# Patient Record
Sex: Female | Born: 1970 | Race: Black or African American | Hispanic: No | Marital: Married | Smoking: Never smoker
Health system: Southern US, Community
[De-identification: ages and names within clinical notes are randomized; demographics above are authoritative.]

## PROBLEM LIST (undated history)

## (undated) DIAGNOSIS — M109 Gout, unspecified: Secondary | ICD-10-CM

## (undated) DIAGNOSIS — E669 Obesity, unspecified: Secondary | ICD-10-CM

## (undated) DIAGNOSIS — I10 Essential (primary) hypertension: Secondary | ICD-10-CM

## (undated) HISTORY — DX: Obesity, unspecified: E66.9

## (undated) HISTORY — DX: Essential (primary) hypertension: I10

---

## 1998-07-18 ENCOUNTER — Encounter: Payer: Self-pay | Admitting: Family Medicine

## 1998-07-18 ENCOUNTER — Ambulatory Visit (HOSPITAL_COMMUNITY): Admission: RE | Admit: 1998-07-18 | Discharge: 1998-07-18 | Payer: Self-pay | Admitting: Family Medicine

## 2005-11-13 ENCOUNTER — Emergency Department (HOSPITAL_COMMUNITY): Admission: EM | Admit: 2005-11-13 | Discharge: 2005-11-13 | Payer: Self-pay | Admitting: Family Medicine

## 2005-11-15 ENCOUNTER — Ambulatory Visit (HOSPITAL_COMMUNITY): Admission: RE | Admit: 2005-11-15 | Discharge: 2005-11-15 | Payer: Self-pay | Admitting: Family Medicine

## 2007-09-30 IMAGING — US US TRANSVAGINAL NON-OB
1 series · 14 of 25 positions shown · non-contrast
Comparison: None available.

CLINICAL DATA: Vaginal bleeding.  Evaluate for fibroids.  
TRANSABDOMINAL AND TRANSVAGINAL PELVIC ULTRASOUND:
TECHNIQUE: Both transabdominal and transvaginal ultrasound examinations of the pelvis were performed including evaluation of the uterus, ovaries, adnexal regions, and pelvic cul-de-sac.

[Series 1: unknown · 0.29mm/px · 14 of 70 slices shown]
[im 1/70]
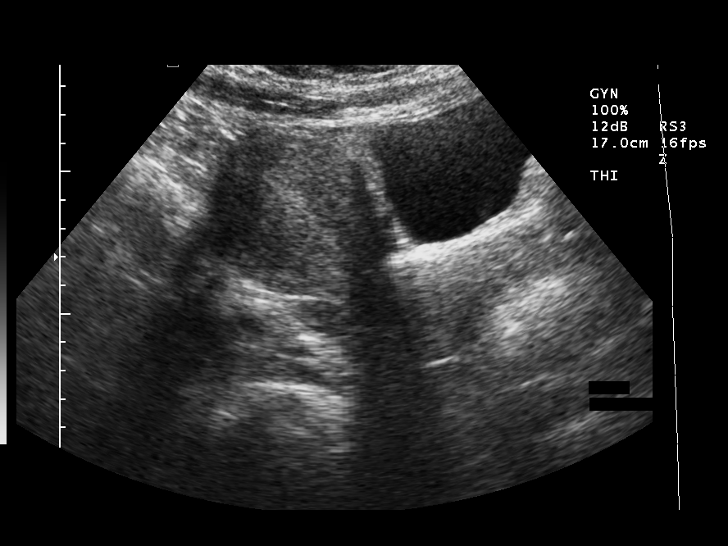
[im 6/70]
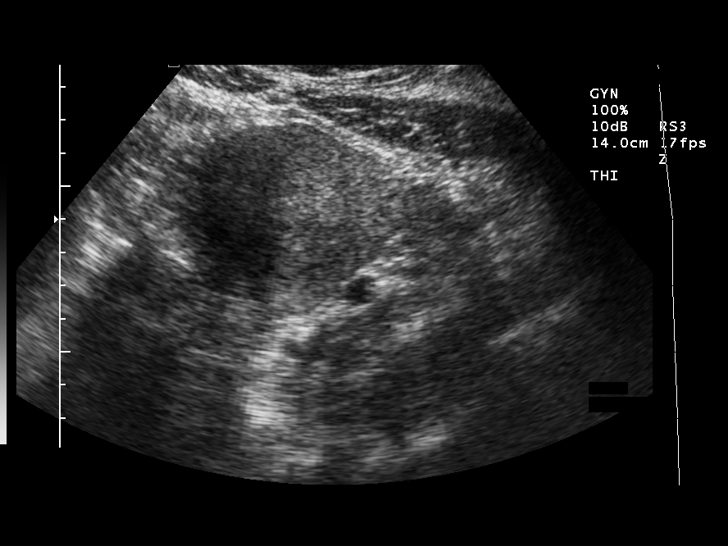
[im 12/70]
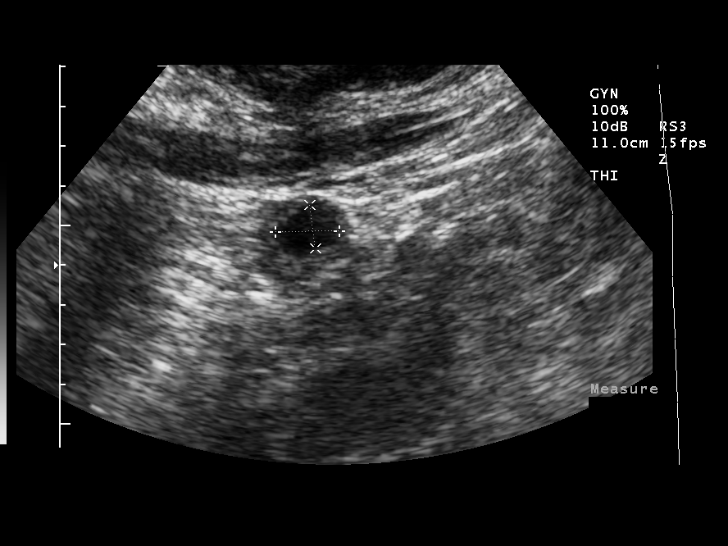
[im 18/70]
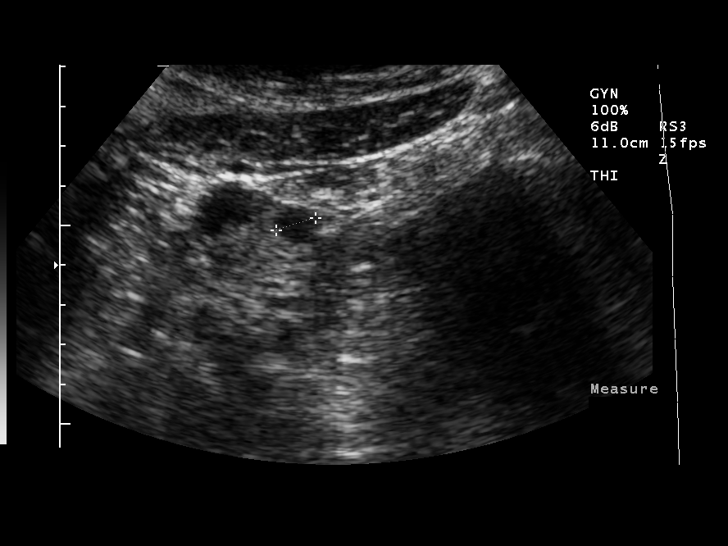
[im 24/70]
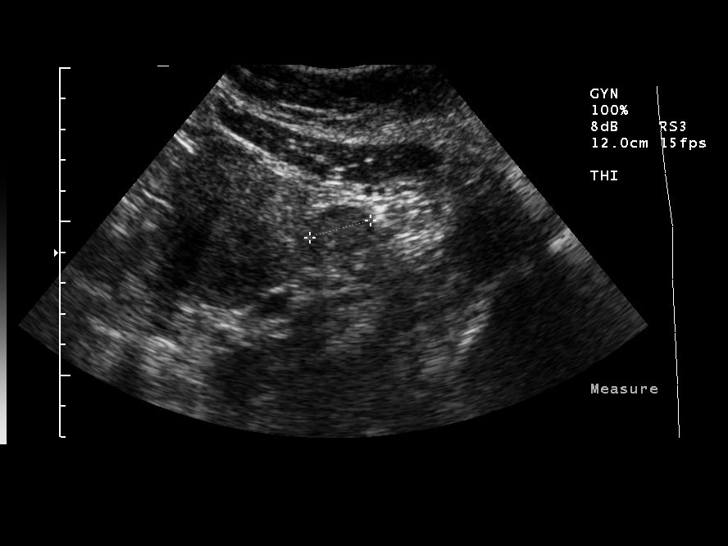
[im 26/70]
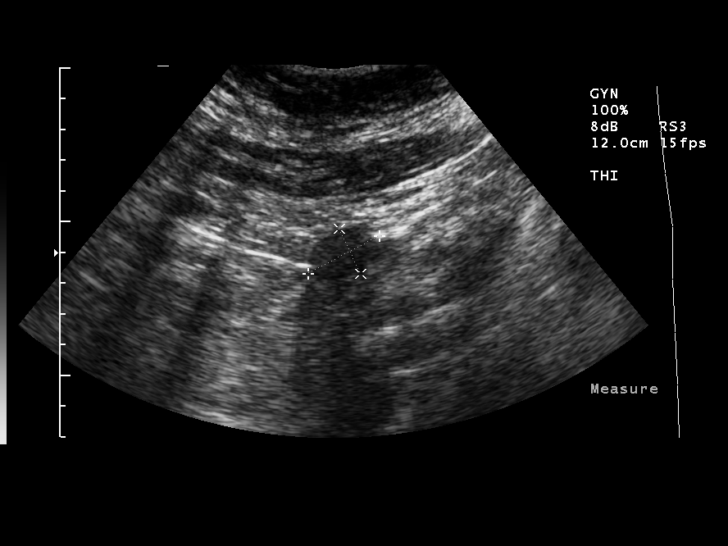
[im 32/70]
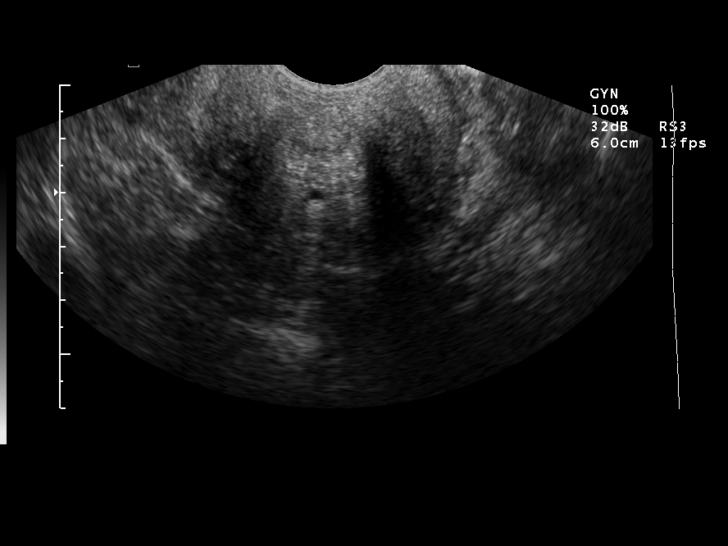
[im 38/70]
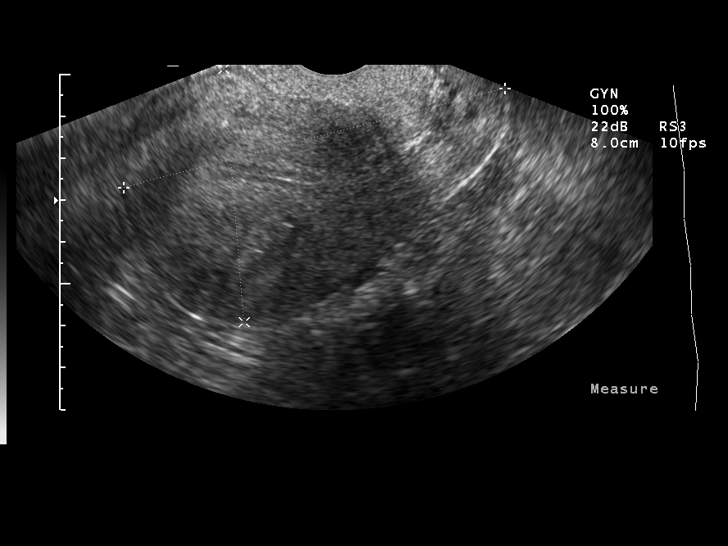
[im 44/70]
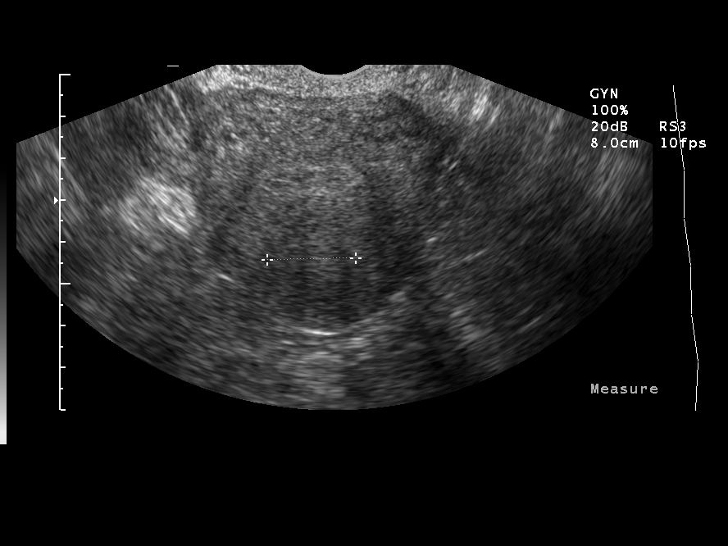
[im 47/70]
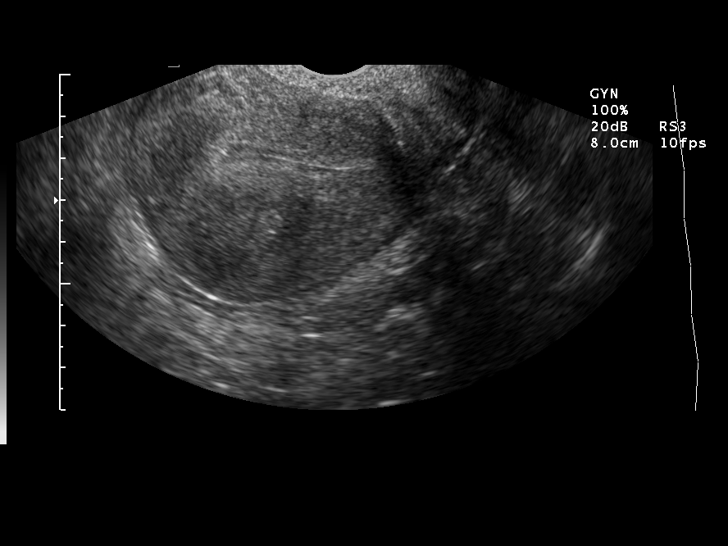
[im 52/70]
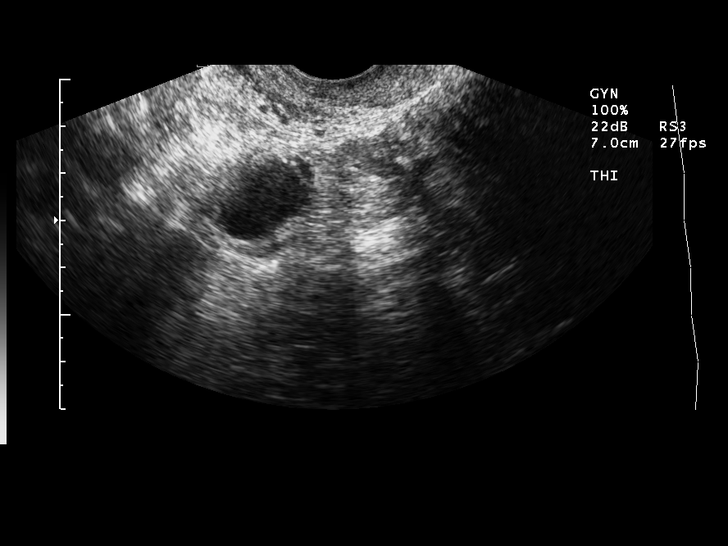
[im 58/70]
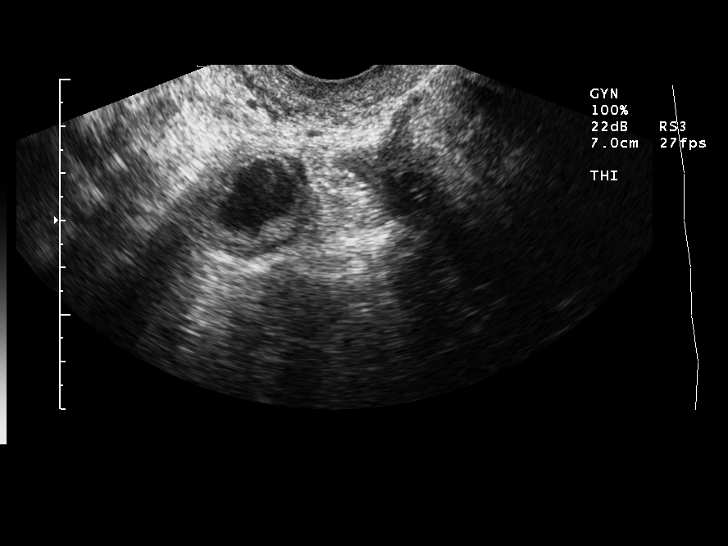
[im 64/70]
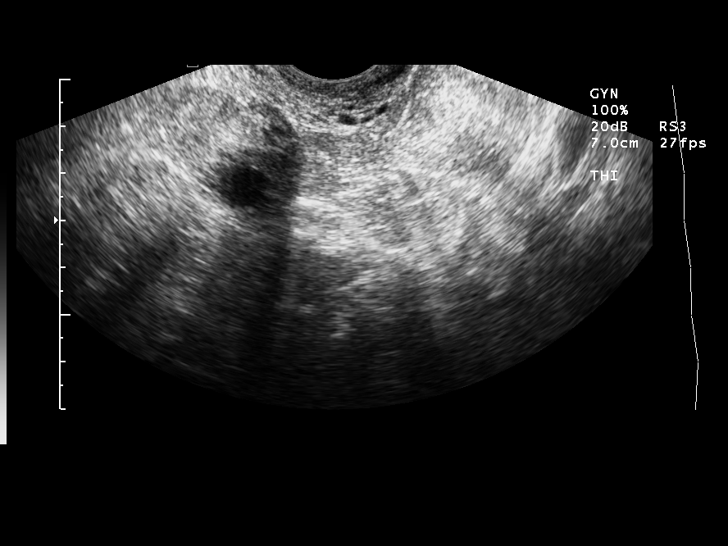
[im 70/70]
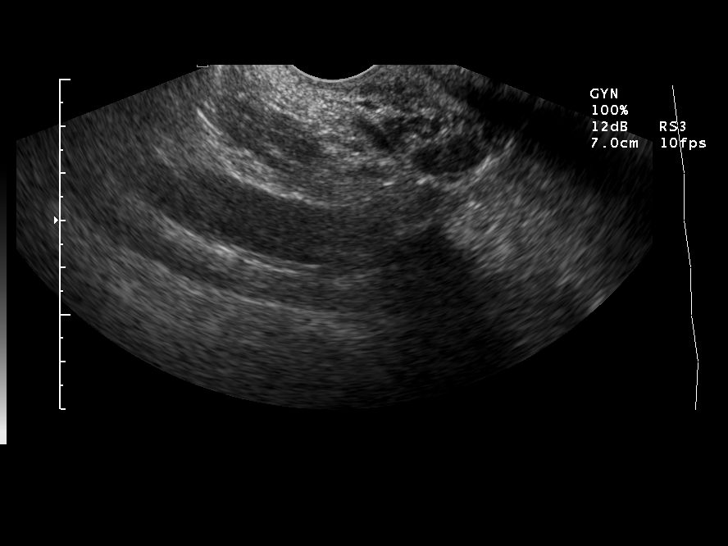

[14 of 25 positions shown; findings below may reference images not displayed]

FINDINGS: The uterus measures 9.4 X 6.1 X 6.1 cm.  Endometrial strip is 6 mm in thickness.  Myometrial echotexture is somewhat heterogeneous, especially in the lower uterine segment.  In the posterior fundus is a focal 2.7 cm area of altered echotexture suggesting an intramural fibroid and there is probably a subcentimeter fibroid within the wall just below this. 
The right ovary measures 3.7 X 2.5 X 2.5 cm and is sonographically normal.  The left ovary measures 2.6 X 1.6 X 2.1 cm and also has normal features.  No evidence for intraperitoneal free fluid.
IMPRESSION: Normal size of uterus with probable 2.7 cm posterior intramural fibroid near the fundus.  The myometrium is fairly heterogeneous in some areas and adenomyosis would also be a consideration.  Consider MRI to further evaluate as clinically appropriate.

## 2010-09-22 ENCOUNTER — Other Ambulatory Visit: Payer: Self-pay | Admitting: Family Medicine

## 2010-09-22 ENCOUNTER — Other Ambulatory Visit: Payer: Self-pay | Admitting: Internal Medicine

## 2010-09-22 DIAGNOSIS — E049 Nontoxic goiter, unspecified: Secondary | ICD-10-CM

## 2010-10-13 ENCOUNTER — Ambulatory Visit
Admission: RE | Admit: 2010-10-13 | Discharge: 2010-10-13 | Disposition: A | Discharge status: Home / Self Care | Payer: Managed Care, Other (non HMO) | Source: Ambulatory Visit | Attending: Family Medicine | Admitting: Family Medicine

## 2010-10-13 DIAGNOSIS — E049 Nontoxic goiter, unspecified: Secondary | ICD-10-CM

## 2011-08-13 ENCOUNTER — Encounter: Payer: Self-pay | Admitting: Family Medicine

## 2011-08-13 ENCOUNTER — Ambulatory Visit (INDEPENDENT_AMBULATORY_CARE_PROVIDER_SITE_OTHER): Payer: Managed Care, Other (non HMO) | Admitting: Family Medicine

## 2011-08-13 DIAGNOSIS — E119 Type 2 diabetes mellitus without complications: Secondary | ICD-10-CM

## 2011-08-13 DIAGNOSIS — Z23 Encounter for immunization: Secondary | ICD-10-CM

## 2011-08-13 DIAGNOSIS — I10 Essential (primary) hypertension: Secondary | ICD-10-CM

## 2011-08-13 DIAGNOSIS — E669 Obesity, unspecified: Secondary | ICD-10-CM

## 2011-08-13 LAB — HEMOGLOBIN A1C: Hgb A1c MFr Bld: 6.6 % — AB (ref 4.0–6.0)

## 2012-06-13 ENCOUNTER — Ambulatory Visit (INDEPENDENT_AMBULATORY_CARE_PROVIDER_SITE_OTHER): Payer: Managed Care, Other (non HMO) | Admitting: Family Medicine

## 2012-06-13 VITALS — BP 130/72 | HR 90 | Temp 98.2°F | Resp 16 | Ht 68.0 in | Wt 289.0 lb

## 2012-06-13 DIAGNOSIS — I10 Essential (primary) hypertension: Secondary | ICD-10-CM

## 2012-06-13 DIAGNOSIS — E119 Type 2 diabetes mellitus without complications: Secondary | ICD-10-CM

## 2012-06-13 LAB — COMPREHENSIVE METABOLIC PANEL
AST: 17 U/L (ref 0–37)
Alkaline Phosphatase: 70 U/L (ref 39–117)
BUN: 11 mg/dL (ref 6–23)
Creat: 0.95 mg/dL (ref 0.50–1.10)

## 2012-06-13 LAB — LIPID PANEL
HDL: 53 mg/dL (ref >39)
LDL Cholesterol: 104 mg/dL — ABNORMAL HIGH (ref 0–99)
Total CHOL/HDL Ratio: 3.3 Ratio

## 2012-06-13 LAB — GLUCOSE, POCT (MANUAL RESULT ENTRY): POC Glucose: 130 mg/dl — AB (ref 70–99)

## 2012-06-13 MED ORDER — LISINOPRIL-HYDROCHLOROTHIAZIDE 20-12.5 MG PO TABS: 1.0000 | ORAL_TABLET | Freq: Every day | ORAL | Status: DC

## 2012-06-13 MED ORDER — METFORMIN HCL 500 MG PO TABS: 500.0000 mg | ORAL_TABLET | Freq: Every day | ORAL | Status: DC

## 2012-06-13 NOTE — Progress Notes (Signed)
  Subjective:    Patient ID: Madison Chandler, female    DOB: September 17, 1970, 41 y.o.   MRN: [id number]  HPI Madison Chandler is a 41 y.o. female  HTN  - outside BP's - 125-131/74-90., no new side effects with meds.  No missed doses usually.  DM2 - off glucophage past 2 days only - usually on QD. Blood sugars 112-129.   Results for orders placed in visit on 08/13/11  HEMOGLOBIN A1C      Component Value Range   Hemoglobin A1C 6.6 (*) 4.0 - 6.0 %     Review of Systems  Constitutional: Negative for fatigue and unexpected weight change.  Respiratory: Negative for chest tightness and shortness of breath.   Cardiovascular: Negative for chest pain, palpitations and leg swelling.  Gastrointestinal: Negative for abdominal pain and blood in stool.  Neurological: Negative for dizziness, syncope, light-headedness and headaches.       Objective:   Physical Exam  Vitals reviewed. Constitutional: She is oriented to person, place, and time. She appears well-developed and well-nourished.  HENT:  Head: Normocephalic and atraumatic.  Mouth/Throat: Oropharynx is clear and moist.  Eyes: Conjunctivae normal and EOM are normal. Pupils are equal, round, and reactive to light.  Neck: Normal range of motion. No thyromegaly present.  Cardiovascular: Normal rate, regular rhythm, normal heart sounds and intact distal pulses.   No murmur heard. Pulmonary/Chest: Effort normal. She has no wheezes. She has no rales.  Abdominal: Soft. Normal appearance. She exhibits no abdominal bruit and no pulsatile midline mass. There is no tenderness.  Neurological: She is alert and oriented to person, place, and time.       Microfilament testing WNL bilat feet  Skin: Skin is warm and dry.  Psychiatric: She has a normal mood and affect. Her behavior is normal.   Results for orders placed in visit on 06/13/12  GLUCOSE, POCT (MANUAL RESULT ENTRY)      Component Value Range   POC Glucose 130 (*) 70 - 99 mg/dl  POCT  GLYCOSYLATED HEMOGLOBIN (HGB A1C)      Component Value Range   Hemoglobin A1C 6.7         Assessment & Plan:  [name] is a 41 y.o. female 1. Diabetes type 2, controlled  POCT glucose (manual entry), POCT glycosylated hemoglobin (Hb A1C), metFORMIN (GLUCOPHAGE) 500 MG tablet, DISCONTINUED: metFORMIN (GLUCOPHAGE) 500 MG tablet  2. Hypertension  Comprehensive metabolic panel, Lipid panel, lisinopril-hydrochlorothiazide (PRINZIDE,ZESTORETIC) 20-12.5 MG per tablet, DISCONTINUED: lisinopril-hydrochlorothiazide (PRINZIDE,ZESTORETIC) 20-12.5 MG per tablet    DM2 - controlled. Continue same med doses. 30 day supply to local pharmacy while waiting for 90day rx. Recheck in 3 months - plans on establishing care with new provider here - names given.  HTN - controlled. Check labs above. 3 month follow up.  rtc precautions.

## 2012-07-07 ENCOUNTER — Telehealth: Payer: Self-pay

## 2012-07-07 MED ORDER — METFORMIN HCL ER 500 MG PO TB24: 500.0000 mg | ORAL_TABLET | Freq: Every day | ORAL | Status: DC

## 2012-07-07 NOTE — Telephone Encounter (Signed)
Patient notified and voiced understanding.

## 2012-07-07 NOTE — Telephone Encounter (Signed)
The patient called to request that her medication be switched back to Metformin XR from the regular Metformin due to abdominal pain and bowel issues.  Please call the patient at [phone].

## 2012-07-07 NOTE — Telephone Encounter (Signed)
Sent to pharmacy - sorry that the patient had bad side effects

## 2012-08-27 IMAGING — US US SOFT TISSUE HEAD/NECK
1 series · 14 of 25 positions shown · non-contrast
Comparison: None.

CLINICAL DATA: Thyroid enlargement on physical exam.

THYROID ULTRASOUND
TECHNIQUE: Ultrasound examination of the thyroid gland and adjacent
soft tissues was performed.

[Series 1: us soft tissue head/neck · 0.06mm/px · 14 of 29 slices shown]
[im 1/29]
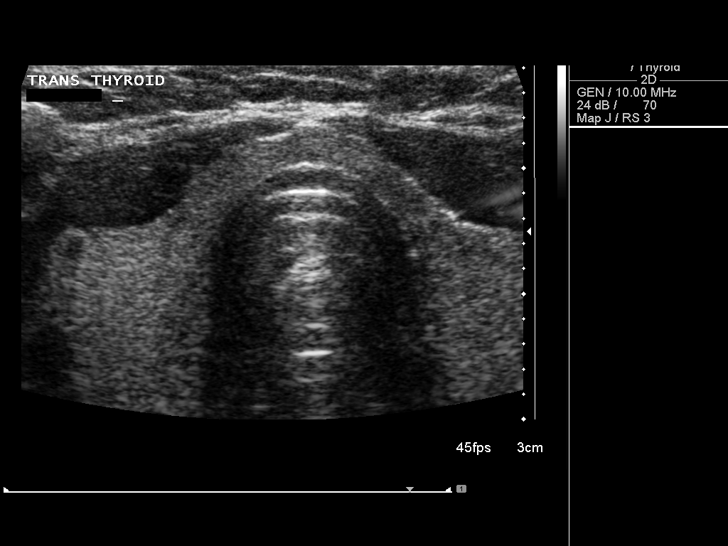
[im 3/29]
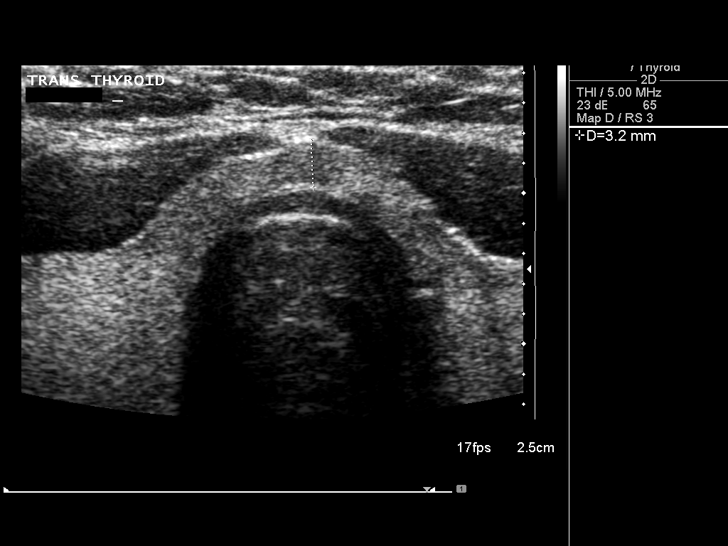
[im 5/29]
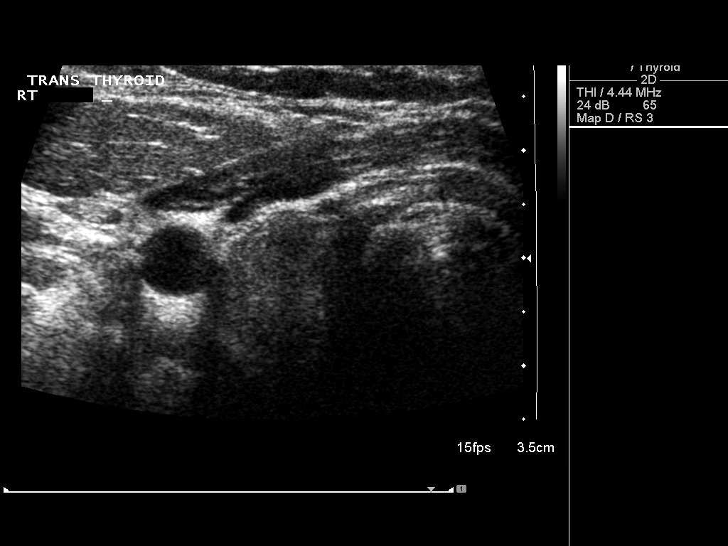
[im 8/29]
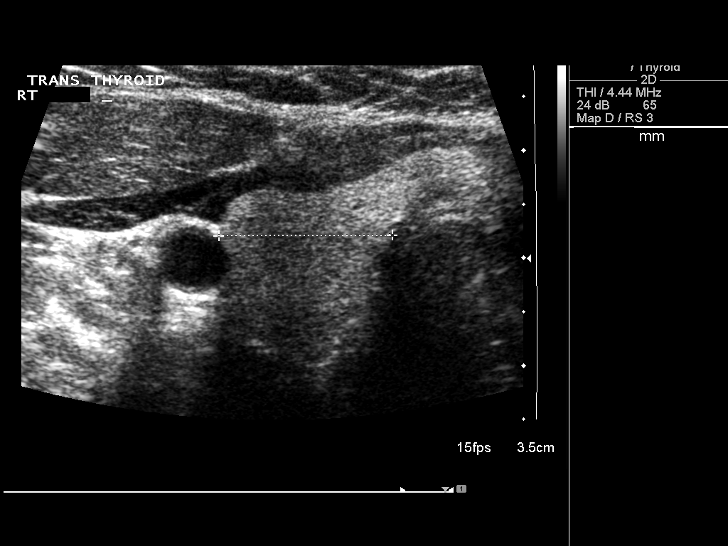
[im 10/29]
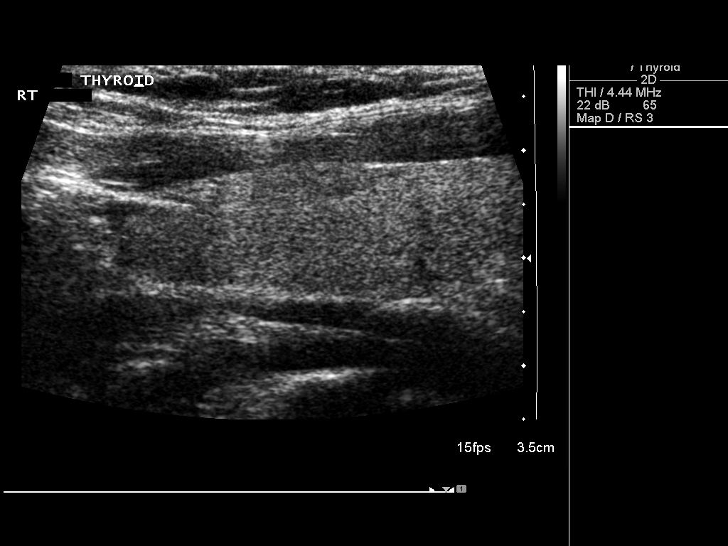
[im 11/29]
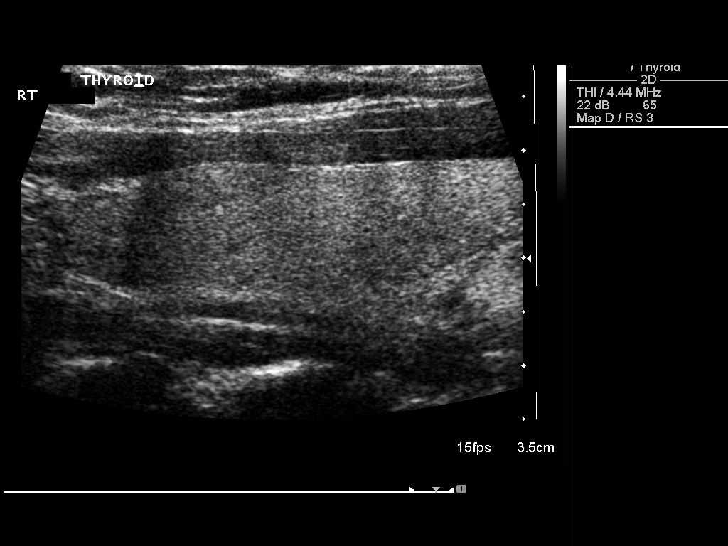
[im 13/29]
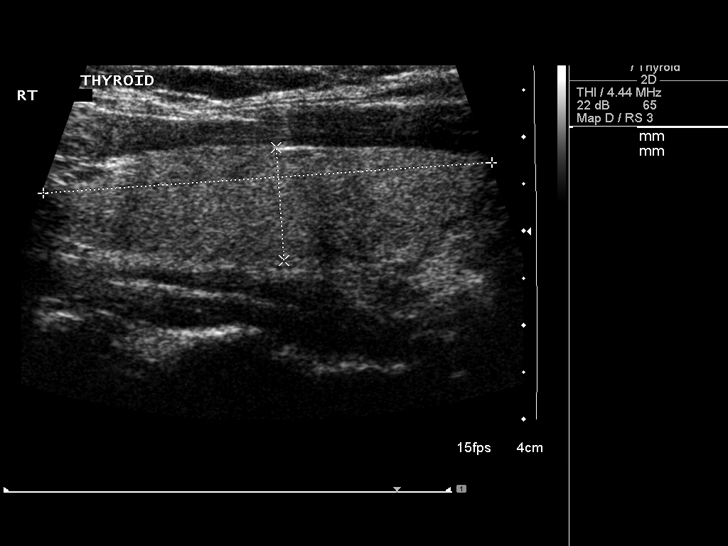
[im 16/29]
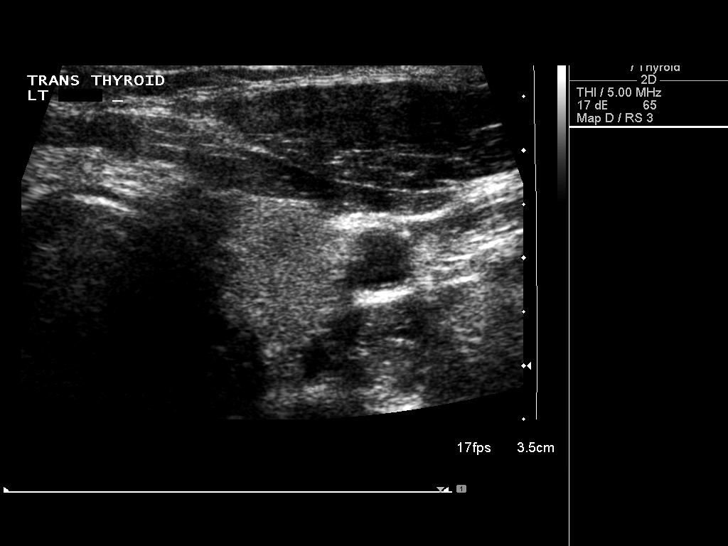
[im 18/29]
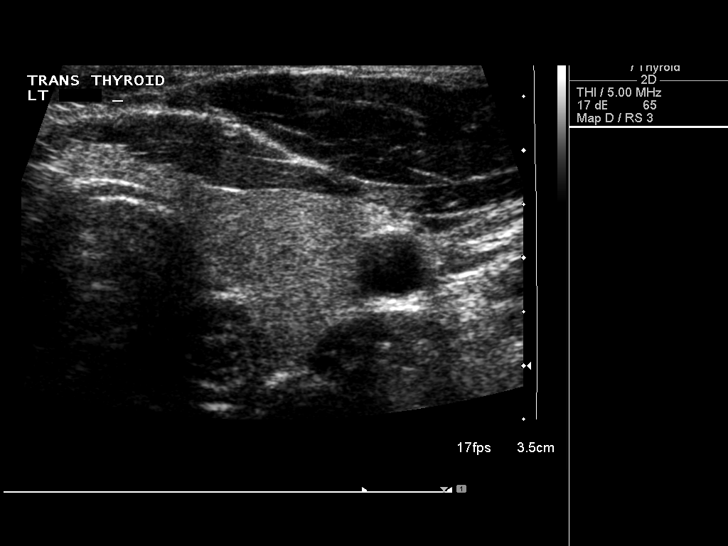
[im 19/29]
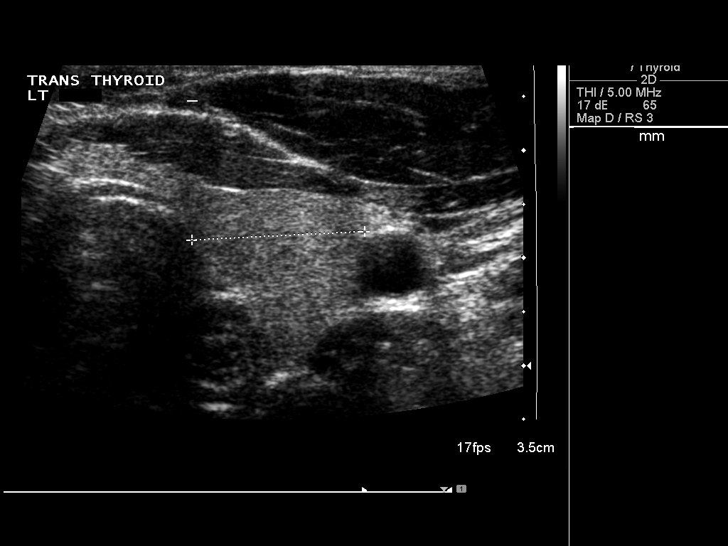
[im 22/29]
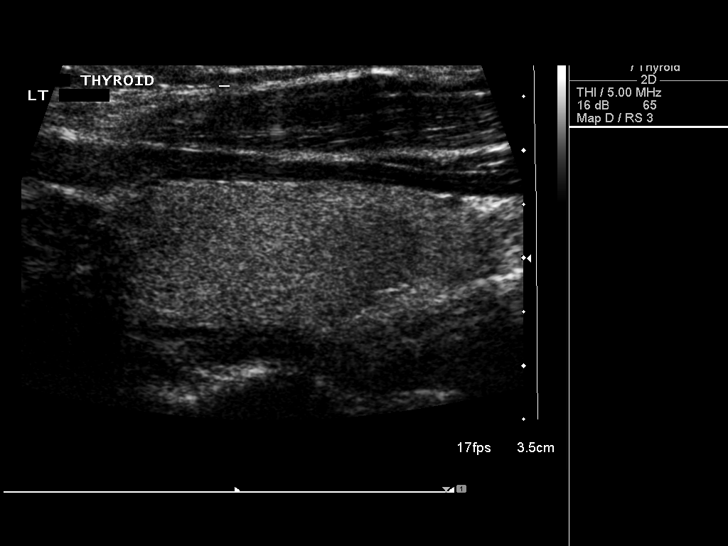
[im 24/29]
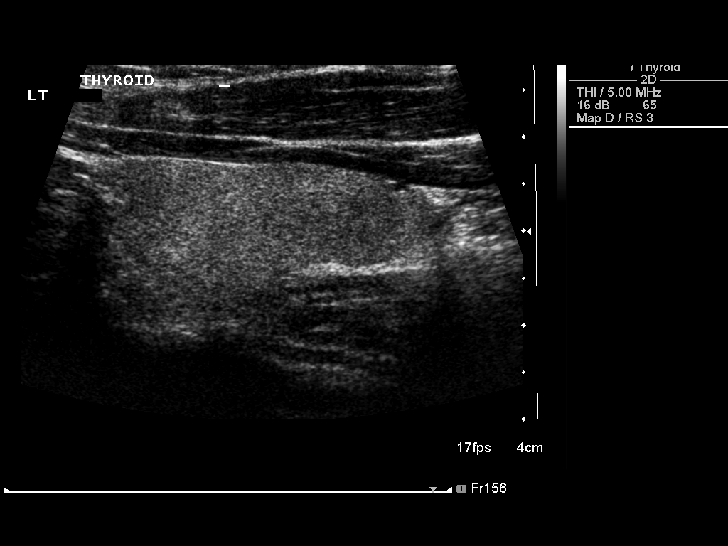
[im 26/29]
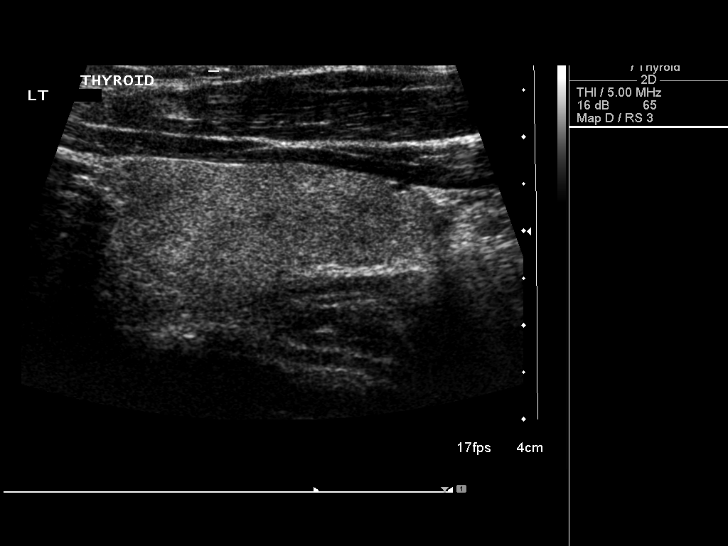
[im 29/29]
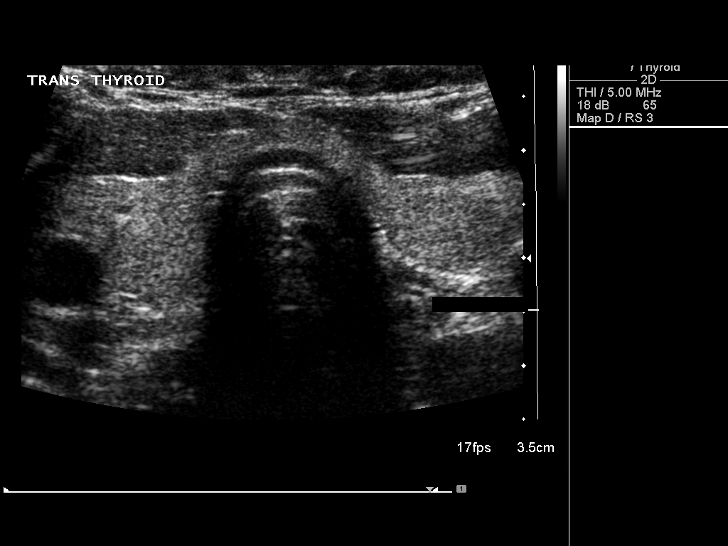

[14 of 25 positions shown; findings below may reference images not displayed]

FINDINGS: Right thyroid lobe:  Normal in size measuring 4.8 cm long X 1.2 cm
AP X 1.6 cm wide.
Left thyroid lobe:  Lower limits of normal in size measuring 3.9 cm
long X 1.6 cm AP X 1.6 cm wide.
Isthmus:  Normal 3 mm AP thickness.

Focal nodules:  Homogeneous thyroid echotexture with no focal
lesion.

Lymphadenopathy:  None visualized.
IMPRESSION: Negative.

## 2012-09-05 ENCOUNTER — Ambulatory Visit: Payer: Managed Care, Other (non HMO) | Admitting: Family Medicine

## 2012-10-02 ENCOUNTER — Other Ambulatory Visit: Payer: Self-pay

## 2012-10-02 MED ORDER — METFORMIN HCL ER 500 MG PO TB24: 500.0000 mg | ORAL_TABLET | Freq: Every day | ORAL | Status: DC

## 2012-10-24 ENCOUNTER — Other Ambulatory Visit: Payer: Self-pay | Admitting: Family Medicine

## 2012-10-24 NOTE — Telephone Encounter (Signed)
Pt due for follow up in Feb 2014, needs visit labs

## 2013-01-05 ENCOUNTER — Telehealth: Payer: Self-pay

## 2013-01-05 ENCOUNTER — Encounter: Payer: Self-pay | Admitting: Family Medicine

## 2013-01-05 ENCOUNTER — Ambulatory Visit (INDEPENDENT_AMBULATORY_CARE_PROVIDER_SITE_OTHER): Payer: Managed Care, Other (non HMO) | Admitting: Family Medicine

## 2013-01-05 VITALS — BP 140/84 | HR 72 | Temp 98.3°F | Resp 16 | Ht 67.5 in | Wt 296.8 lb

## 2013-01-05 DIAGNOSIS — E119 Type 2 diabetes mellitus without complications: Secondary | ICD-10-CM

## 2013-01-05 DIAGNOSIS — R079 Chest pain, unspecified: Secondary | ICD-10-CM

## 2013-01-05 DIAGNOSIS — R1011 Right upper quadrant pain: Secondary | ICD-10-CM

## 2013-01-05 DIAGNOSIS — K219 Gastro-esophageal reflux disease without esophagitis: Secondary | ICD-10-CM

## 2013-01-05 LAB — COMPREHENSIVE METABOLIC PANEL
Albumin: 3.4 g/dL — ABNORMAL LOW (ref 3.5–5.2)
Alkaline Phosphatase: 66 U/L (ref 39–117)
CO2: 26 mEq/L (ref 19–32)
Calcium: 9.2 mg/dL (ref 8.4–10.5)
Chloride: 103 mEq/L (ref 96–112)
Glucose, Bld: 164 mg/dL — ABNORMAL HIGH (ref 70–99)
Potassium: 4.3 mEq/L (ref 3.5–5.3)
Sodium: 138 mEq/L (ref 135–145)
Total Protein: 7 g/dL (ref 6.0–8.3)

## 2013-01-05 LAB — POCT CBC
HCT, POC: 40.1 % (ref 37.7–47.9)
Hemoglobin: 12.3 g/dL (ref 12.2–16.2)
Lymph, poc: 1.9 (ref 0.6–3.4)
MCHC: 30.7 g/dL — AB (ref 31.8–35.4)
MCV: 90.5 fL (ref 80–97)
POC Granulocyte: 2.5 (ref 2–6.9)
WBC: 4.8 10*3/uL (ref 4.6–10.2)

## 2013-01-05 LAB — POCT GLYCOSYLATED HEMOGLOBIN (HGB A1C): Hemoglobin A1C: 7.1

## 2013-01-05 LAB — LIPID PANEL
HDL: 52 mg/dL (ref >39)
LDL Cholesterol: 95 mg/dL (ref 0–99)
Triglycerides: 88 mg/dL (ref <150)

## 2013-01-05 MED ORDER — METOCLOPRAMIDE HCL 5 MG PO TABS: 5.0000 mg | ORAL_TABLET | Freq: Four times a day (QID) | ORAL | Status: AC

## 2013-01-05 MED ORDER — OMEPRAZOLE 40 MG PO CPDR: 40.0000 mg | DELAYED_RELEASE_CAPSULE | Freq: Every day | ORAL | Status: AC

## 2013-01-05 NOTE — Patient Instructions (Addendum)
Avoid fried, fatty, and spicy foods.  Take omeprazole 40 mg one daily.  Take Reglan (metoclopramide) 1 before meals if having reflux symptoms.  Return if further right upper quadrant pains

## 2013-01-05 NOTE — Progress Notes (Signed)
Subjective: Patient is here complaining of GERD pain. Apparently yesterday she had eaten some pizza. Later she developed reflux sensation pain in her upper chest. She had a lump in her throat sensation. She had some discomfort in her  right upper. quadrant. She has had a lot of reflux, but this is different than usual. Back. She was supposed come back in 3 months after last visit for followup, but did not. She checks her sugars and they're usually fasting 130.  She walks 3 days a week.  Objective: Obese lady in no major distress this morning. For per square high as noted. She apparently got early this morning at her mother to the hospital. Her TMs are normal. Throat clear. Neck supple without nodes thyromegaly. Chest is clear to auscultation. Heart regular without murmurs gallops or arrhythmias. No chest wall tenderness. Abdomen no bowel sounds, soft without mass tenderness. Extremities unremarkable.  Assessment: Atypical chest pain GERD Right upper quadrant abdominal pain Diabetes Obesity  Plan: Glucose, A1c, CBC, C. met, lipids, EKG  Results for orders placed in visit on 01/05/13  POCT CBC      Result Value Range   WBC 4.8  4.6 - 10.2 K/uL   Lymph, poc 1.9  0.6 - 3.4   POC LYMPH PERCENT 39.1  10 - 50 %L   MID (cbc) 0.5  0 - 0.9   POC MID % 9.8  0 - 12 %M   POC Granulocyte 2.5  2 - 6.9   Granulocyte percent 51.1  37 - 80 %G   RBC 4.43  4.04 - 5.48 M/uL   Hemoglobin 12.3  12.2 - 16.2 g/dL   HCT, POC 16.1  09.6 - 47.9 %   MCV 90.5  80 - 97 fL   MCH, POC 27.8  27 - 31.2 pg   MCHC 30.7 (*) 31.8 - 35.4 g/dL   RDW, POC 04.5     Platelet Count, POC 147  142 - 424 K/uL   MPV 10.7  0 - 99.8 fL  POCT GLYCOSYLATED HEMOGLOBIN (HGB A1C)      Result Value Range   Hemoglobin A1C 7.1    GLUCOSE, POCT (MANUAL RESULT ENTRY)      Result Value Range   POC Glucose 171 (*) 70 - 99 mg/dl   EKG normal Assessment: GERD Hypertension Diabetes unsatisfactory control

## 2013-01-05 NOTE — Telephone Encounter (Signed)
Patient says this is the number to call for her prior auth for prescription Dr. Alwyn Ren gave her today: [phone]

## 2013-01-06 NOTE — Telephone Encounter (Signed)
PA form was completed yesterday and faxed to ins.

## 2013-01-06 NOTE — Telephone Encounter (Signed)
PA approved for omeprazole through 01/06/16. Notified pharmacy.

## 2013-01-07 ENCOUNTER — Encounter: Payer: Self-pay | Admitting: Family Medicine

## 2013-02-17 ENCOUNTER — Other Ambulatory Visit: Payer: Self-pay | Admitting: Physician Assistant

## 2013-03-18 ENCOUNTER — Other Ambulatory Visit: Payer: Self-pay | Admitting: Physician Assistant

## 2013-07-06 ENCOUNTER — Other Ambulatory Visit: Payer: Self-pay | Admitting: Physician Assistant

## 2013-07-07 ENCOUNTER — Other Ambulatory Visit: Payer: Self-pay | Admitting: Physician Assistant

## 2013-08-04 ENCOUNTER — Ambulatory Visit (INDEPENDENT_AMBULATORY_CARE_PROVIDER_SITE_OTHER): Payer: Managed Care, Other (non HMO) | Admitting: Internal Medicine

## 2013-08-04 ENCOUNTER — Ambulatory Visit: Payer: Managed Care, Other (non HMO)

## 2013-08-04 VITALS — BP 136/82 | HR 98 | Temp 98.1°F | Resp 18 | Ht 68.0 in | Wt 289.0 lb

## 2013-08-04 DIAGNOSIS — R8290 Unspecified abnormal findings in urine: Secondary | ICD-10-CM

## 2013-08-04 DIAGNOSIS — E663 Overweight: Secondary | ICD-10-CM

## 2013-08-04 DIAGNOSIS — M79671 Pain in right foot: Secondary | ICD-10-CM

## 2013-08-04 DIAGNOSIS — Z5181 Encounter for therapeutic drug level monitoring: Secondary | ICD-10-CM

## 2013-08-04 DIAGNOSIS — I10 Essential (primary) hypertension: Secondary | ICD-10-CM

## 2013-08-04 DIAGNOSIS — E119 Type 2 diabetes mellitus without complications: Secondary | ICD-10-CM

## 2013-08-04 DIAGNOSIS — M79609 Pain in unspecified limb: Secondary | ICD-10-CM

## 2013-08-04 LAB — POCT UA - MICROSCOPIC ONLY
AMORPHOUS: POSITIVE
CASTS, UR, LPF, POC: NEGATIVE
CRYSTALS, UR, HPF, POC: NEGATIVE
Yeast, UA: NEGATIVE

## 2013-08-04 LAB — LIPID PANEL
Cholesterol: 193 mg/dL (ref 0–200)
HDL: 60 mg/dL (ref >39)
LDL CALC: 117 mg/dL — AB (ref 0–99)
Total CHOL/HDL Ratio: 3.2 Ratio
Triglycerides: 80 mg/dL (ref <150)
VLDL: 16 mg/dL (ref 0–40)

## 2013-08-04 LAB — COMPREHENSIVE METABOLIC PANEL
ALK PHOS: 76 U/L (ref 39–117)
ALT: 22 U/L (ref 0–35)
AST: 22 U/L (ref 0–37)
Albumin: 3.8 g/dL (ref 3.5–5.2)
BILIRUBIN TOTAL: 0.4 mg/dL (ref 0.3–1.2)
BUN: 10 mg/dL (ref 6–23)
CO2: 28 mEq/L (ref 19–32)
CREATININE: 0.97 mg/dL (ref 0.50–1.10)
Calcium: 9.1 mg/dL (ref 8.4–10.5)
Chloride: 103 mEq/L (ref 96–112)
Glucose, Bld: 210 mg/dL — ABNORMAL HIGH (ref 70–99)
Potassium: 4.1 mEq/L (ref 3.5–5.3)
SODIUM: 139 meq/L (ref 135–145)
TOTAL PROTEIN: 7.2 g/dL (ref 6.0–8.3)

## 2013-08-04 LAB — POCT CBC
GRANULOCYTE PERCENT: 53.3 % (ref 37–80)
HEMATOCRIT: 43.1 % (ref 37.7–47.9)
HEMOGLOBIN: 13.4 g/dL (ref 12.2–16.2)
Lymph, poc: 1.7 (ref 0.6–3.4)
MCH, POC: 27.6 pg (ref 27–31.2)
MCHC: 31.1 g/dL — AB (ref 31.8–35.4)
MCV: 88.6 fL (ref 80–97)
MID (cbc): 0.4 (ref 0–0.9)
MPV: 12.3 fL (ref 0–99.8)
POC GRANULOCYTE: 2.3 (ref 2–6.9)
POC LYMPH PERCENT: 37.6 %L (ref 10–50)
POC MID %: 9.1 % (ref 0–12)
Platelet Count, POC: 146 10*3/uL (ref 142–424)
RBC: 4.86 M/uL (ref 4.04–5.48)
RDW, POC: 15.2 %
WBC: 4.4 10*3/uL — AB (ref 4.6–10.2)

## 2013-08-04 LAB — POCT URINALYSIS DIPSTICK
Bilirubin, UA: NEGATIVE
Blood, UA: NEGATIVE
Glucose, UA: NEGATIVE
KETONES UA: NEGATIVE
NITRITE UA: NEGATIVE
PH UA: 5.5
PROTEIN UA: NEGATIVE
Spec Grav, UA: >=1.03
UROBILINOGEN UA: 0.2

## 2013-08-04 LAB — GLUCOSE, POCT (MANUAL RESULT ENTRY): POC GLUCOSE: 214 mg/dL — AB (ref 70–99)

## 2013-08-04 LAB — MICROALBUMIN, URINE: Microalb, Ur: 0.89 mg/dL (ref 0.00–1.89)

## 2013-08-04 LAB — TSH: TSH: 2.33 u[IU]/mL (ref 0.350–4.500)

## 2013-08-04 LAB — POCT GLYCOSYLATED HEMOGLOBIN (HGB A1C): HEMOGLOBIN A1C: 8.9

## 2013-08-04 MED ORDER — METFORMIN HCL 1000 MG PO TABS: 1000.0000 mg | ORAL_TABLET | Freq: Two times a day (BID) | ORAL | Status: AC

## 2013-08-04 MED ORDER — ATORVASTATIN CALCIUM 10 MG PO TABS: 10.0000 mg | ORAL_TABLET | Freq: Every day | ORAL | Status: AC

## 2013-08-04 MED ORDER — LISINOPRIL-HYDROCHLOROTHIAZIDE 20-12.5 MG PO TABS: 1.0000 | ORAL_TABLET | Freq: Every day | ORAL | Status: AC

## 2013-08-04 NOTE — Progress Notes (Signed)
   Subjective:    Patient ID: Madison Chandler, female    DOB: 1970/08/09, 43 y.o.   MRN: [id number]  HPI    Review of Systems     Objective:   Physical Exam        Assessment & Plan:

## 2013-08-04 NOTE — Progress Notes (Signed)
Subjective:    Patient ID: Madison Chandler, female    DOB: [birth year], 43 y.o.   MRN: [id number]  HPI  43 y.o. Female presents to clinic for right heel pain. Noticed after after walking a parade yesterday and noticed after walking it was throbbing. Denies any previous injury . Notices pain more so in the morning when getting up. Has had cortisone shots in this heel before with a lot of relief. Is also having trouble with right knee. Believes it to be from trying to relieve pressure on heel. Is having some pain on the outer side of her calf muscle . Noticed this yesterday. Took extras strength tylenol without much relief.  Has NIDDM, not controlled,Review of Systems      Objective:   Physical Exam  Vitals reviewed. Constitutional: She is oriented to person, place, and time. She appears well-developed and well-nourished.  HENT:  Head: Normocephalic.  Eyes: EOM are normal. Pupils are equal, round, and reactive to light.  Neck: Normal range of motion.  Cardiovascular: Normal rate.   Pulmonary/Chest: Effort normal.  Neurological: She is alert and oriented to person, place, and time. No sensory deficit. She exhibits normal muscle tone. Coordination normal.  Psychiatric: She has a normal mood and affect.     [facility] reading (PRIMARY) by  Dr.Guest heel spur  Results for orders placed in visit on 08/04/13  POCT CBC      Result Value Range   WBC 4.4 (*) 4.6 - 10.2 K/uL   Lymph, poc 1.7  0.6 - 3.4   POC LYMPH PERCENT 37.6  10 - 50 %L   MID (cbc) 0.4  0 - 0.9   POC MID % 9.1  0 - 12 %M   POC Granulocyte 2.3  2 - 6.9   Granulocyte percent 53.3  37 - 80 %G   RBC 4.86  4.04 - 5.48 M/uL   Hemoglobin 13.4  12.2 - 16.2 g/dL   HCT, POC 40.9  81.1 - 47.9 %   MCV 88.6  80 - 97 fL   MCH, POC 27.6  27 - 31.2 pg   MCHC 31.1 (*) 31.8 - 35.4 g/dL   RDW, POC 91.4     Platelet Count, POC 146  142 - 424 K/uL   MPV 12.3  0 - 99.8 fL  GLUCOSE, POCT (MANUAL RESULT ENTRY)      Result Value Range   POC  Glucose 214 (*) 70 - 99 mg/dl  POCT GLYCOSYLATED HEMOGLOBIN (HGB A1C)      Result Value Range   Hemoglobin A1C 8.9    POCT UA - MICROSCOPIC ONLY      Result Value Range   WBC, Ur, HPF, POC 6-14 clusters     RBC, urine, microscopic 0-1     Bacteria, U Microscopic 3+     Mucus, UA mod     Epithelial cells, urine per micros 3-10     Crystals, Ur, HPF, POC neg     Casts, Ur, LPF, POC neg     Yeast, UA neg     Amorphous pos    POCT URINALYSIS DIPSTICK      Result Value Range   Color, UA yellow     Clarity, UA cloudy     Glucose, UA neg     Bilirubin, UA neg     Ketones, UA neg     Spec Grav, UA >=1.030     Blood, UA neg     pH, UA 5.5  Protein, UA neg     Urobilinogen, UA 0.2     Nitrite, UA neg     Leukocytes, UA small (1+)            Assessment & Plan:  Start lipitor Start metformin 1g bid RF lisinopril Fit in camwalker/Plantar fasciitis care/see podiatrist Schedule 104 regular appts

## 2013-08-04 NOTE — Patient Instructions (Signed)
Plantar Fasciitis (Heel Spur Syndrome) with Rehab The plantar fascia is a fibrous, ligament-like, soft-tissue structure that spans the bottom of the foot. Plantar fasciitis is a condition that causes pain in the foot due to inflammation of the tissue. SYMPTOMS   Pain and tenderness on the underneath side of the foot.  Pain that worsens with standing or walking. CAUSES  Plantar fasciitis is caused by irritation and injury to the plantar fascia on the underneath side of the foot. Common mechanisms of injury include:  Direct trauma to bottom of the foot.  Damage to a small nerve that runs under the foot where the main fascia attaches to the heel bone.  Stress placed on the plantar fascia due to bone spurs. RISK INCREASES WITH:   Activities that place stress on the plantar fascia (running, jumping, pivoting, or cutting).  Poor strength and flexibility.  Improperly fitted shoes.  Tight calf muscles.  Flat feet.  Failure to warm-up properly before activity.  Obesity. PREVENTION  Warm up and stretch properly before activity.  Allow for adequate recovery between workouts.  Maintain physical fitness:  Strength, flexibility, and endurance.  Cardiovascular fitness.  Maintain a health body weight.  Avoid stress on the plantar fascia.  Wear properly fitted shoes, including arch supports for individuals who have flat feet. PROGNOSIS  If treated properly, then the symptoms of plantar fasciitis usually resolve without surgery. However, occasionally surgery is necessary. RELATED COMPLICATIONS   Recurrent symptoms that may result in a chronic condition.  Problems of the lower back that are caused by compensating for the injury, such as limping.  Pain or weakness of the foot during push-off following surgery.  Chronic inflammation, scarring, and partial or complete fascia tear, occurring more often from repeated injections. TREATMENT  Treatment initially involves the use of  ice and medication to help reduce pain and inflammation. The use of strengthening and stretching exercises may help reduce pain with activity, especially stretches of the Achilles tendon. These exercises may be performed at home or with a therapist. Your caregiver may recommend that you use heel cups of arch supports to help reduce stress on the plantar fascia. Occasionally, corticosteroid injections are given to reduce inflammation. If symptoms persist for greater than 6 months despite non-surgical (conservative), then surgery may be recommended.  MEDICATION   If pain medication is necessary, then nonsteroidal anti-inflammatory medications, such as aspirin and ibuprofen, or other minor pain relievers, such as acetaminophen, are often recommended.  Do not take pain medication within 7 days before surgery.  Prescription pain relievers may be given if deemed necessary by your caregiver. Use only as directed and only as much as you need.  Corticosteroid injections may be given by your caregiver. These injections should be reserved for the most serious cases, because they may only be given a certain number of times. HEAT AND COLD  Cold treatment (icing) relieves pain and reduces inflammation. Cold treatment should be applied for 10 to 15 minutes every 2 to 3 hours for inflammation and pain and immediately after any activity that aggravates your symptoms. Use ice packs or massage the area with a piece of ice (ice massage).  Heat treatment may be used prior to performing the stretching and strengthening activities prescribed by your caregiver, physical therapist, or athletic trainer. Use a heat pack or soak the injury in warm water. SEEK IMMEDIATE MEDICAL CARE IF:  Treatment seems to offer no benefit, or the condition worsens.  Any medications produce adverse side effects. EXERCISES RANGE   OF MOTION (ROM) AND STRETCHING EXERCISES - Plantar Fasciitis (Heel Spur Syndrome) These exercises may help you  when beginning to rehabilitate your injury. Your symptoms may resolve with or without further involvement from your physician, physical therapist or athletic trainer. While completing these exercises, remember:   Restoring tissue flexibility helps normal motion to return to the joints. This allows healthier, less painful movement and activity.  An effective stretch should be held for at least 30 seconds.  A stretch should never be painful. You should only feel a gentle lengthening or release in the stretched tissue. RANGE OF MOTION - Toe Extension, Flexion  Sit with your right / left leg crossed over your opposite knee.  Grasp your toes and gently pull them back toward the top of your foot. You should feel a stretch on the bottom of your toes and/or foot.  Hold this stretch for __________ seconds.  Now, gently pull your toes toward the bottom of your foot. You should feel a stretch on the top of your toes and or foot.  Hold this stretch for __________ seconds. Repeat __________ times. Complete this stretch __________ times per day.  RANGE OF MOTION - Ankle Dorsiflexion, Active Assisted  Remove shoes and sit on a chair that is preferably not on a carpeted surface.  Place right / left foot under knee. Extend your opposite leg for support.  Keeping your heel down, slide your right / left foot back toward the chair until you feel a stretch at your ankle or calf. If you do not feel a stretch, slide your bottom forward to the edge of the chair, while still keeping your heel down.  Hold this stretch for __________ seconds. Repeat __________ times. Complete this stretch __________ times per day.  STRETCH  Gastroc, Standing  Place hands on wall.  Extend right / left leg, keeping the front knee somewhat bent.  Slightly point your toes inward on your back foot.  Keeping your right / left heel on the floor and your knee straight, shift your weight toward the wall, not allowing your back to  arch.  You should feel a gentle stretch in the right / left calf. Hold this position for __________ seconds. Repeat __________ times. Complete this stretch __________ times per day. STRETCH  Soleus, Standing  Place hands on wall.  Extend right / left leg, keeping the other knee somewhat bent.  Slightly point your toes inward on your back foot.  Keep your right / left heel on the floor, bend your back knee, and slightly shift your weight over the back leg so that you feel a gentle stretch deep in your back calf.  Hold this position for __________ seconds. Repeat __________ times. Complete this stretch __________ times per day. STRETCH  Gastrocsoleus, Standing  Note: This exercise can place a lot of stress on your foot and ankle. Please complete this exercise only if specifically instructed by your caregiver.   Place the ball of your right / left foot on a step, keeping your other foot firmly on the same step.  Hold on to the wall or a rail for balance.  Slowly lift your other foot, allowing your body weight to press your heel down over the edge of the step.  You should feel a stretch in your right / left calf.  Hold this position for __________ seconds.  Repeat this exercise with a slight bend in your right / left knee. Repeat __________ times. Complete this stretch __________ times per day.    STRENGTHENING EXERCISES - Plantar Fasciitis (Heel Spur Syndrome)  These exercises may help you when beginning to rehabilitate your injury. They may resolve your symptoms with or without further involvement from your physician, physical therapist or athletic trainer. While completing these exercises, remember:   Muscles can gain both the endurance and the strength needed for everyday activities through controlled exercises.  Complete these exercises as instructed by your physician, physical therapist or athletic trainer. Progress the resistance and repetitions only as guided. STRENGTH - Towel  Curls  Sit in a chair positioned on a non-carpeted surface.  Place your foot on a towel, keeping your heel on the floor.  Pull the towel toward your heel by only curling your toes. Keep your heel on the floor.  If instructed by your physician, physical therapist or athletic trainer, add ____________________ at the end of the towel. Repeat __________ times. Complete this exercise __________ times per day. STRENGTH - Ankle Inversion  Secure one end of a rubber exercise band/tubing to a fixed object (table, pole). Loop the other end around your foot just before your toes.  Place your fists between your knees. This will focus your strengthening at your ankle.  Slowly, pull your big toe up and in, making sure the band/tubing is positioned to resist the entire motion.  Hold this position for __________ seconds.  Have your muscles resist the band/tubing as it slowly pulls your foot back to the starting position. Repeat __________ times. Complete this exercises __________ times per day.  Document Released: 07/02/2005 Document Revised: 09/24/2011 Document Reviewed: 10/14/2008 [facility] Patient Information 2014 [place], [place]. Diabetes Meal Planning Guide The diabetes meal planning guide is a tool to help you plan your meals and snacks. It is important for people with diabetes to manage their blood glucose (sugar) levels. Choosing the right foods and the right amounts throughout your day will help control your blood glucose. Eating right can even help you improve your blood pressure and reach or maintain a healthy weight. CARBOHYDRATE COUNTING MADE EASY When you eat carbohydrates, they turn to sugar. This raises your blood glucose level. Counting carbohydrates can help you control this level so you feel better. When you plan your meals by counting carbohydrates, you can have more flexibility in what you eat and balance your medicine with your food intake. Carbohydrate counting simply means adding  up the total amount of carbohydrate grams in your meals and snacks. Try to eat about the same amount at each meal. Foods with carbohydrates are listed below. Each portion below is 1 carbohydrate serving or 15 grams of carbohydrates. Ask your dietician how many grams of carbohydrates you should eat at each meal or snack. Grains and Starches  1 slice bread.   English muffin or hotdog/hamburger bun.   cup cold cereal (unsweetened).   cup cooked pasta or rice.   cup starchy vegetables (corn, potatoes, peas, beans, winter squash).  1 tortilla (6 inches).   bagel.  1 waffle or pancake (size of a CD).   cup cooked cereal.  4 to 6 small crackers. *Whole grain is recommended. Fruit  1 cup fresh unsweetened berries, melon, papaya, pineapple.  1 small fresh fruit.   banana or mango.   cup fruit juice (4 oz unsweetened).   cup canned fruit in natural juice or water.  2 tbs dried fruit.  12 to 15 grapes or cherries. Milk and Yogurt  1 cup fat-free or 1% milk.  1 cup soy milk.  6 oz light yogurt with  sugar-free sweetener.  6 oz low-fat soy yogurt.  6 oz plain yogurt. Vegetables  1 cup raw or  cup cooked is counted as 0 carbohydrates or a "free" food.  If you eat 3 or more servings at 1 meal, count them as 1 carbohydrate serving. Other Carbohydrates   oz chips or pretzels.   cup ice cream or frozen yogurt.   cup sherbet or sorbet.  2 inch square cake, no frosting.  1 tbs honey, sugar, jam, jelly, or syrup.  2 small cookies.  3 squares of graham crackers.  3 cups popcorn.  6 crackers.  1 cup broth-based soup.  Count 1 cup casserole or other mixed foods as 2 carbohydrate servings.  Foods with less than 20 calories in a serving may be counted as 0 carbohydrates or a "free" food. You may want to purchase a book or computer software that lists the carbohydrate gram counts of different foods. In addition, the nutrition facts panel on the labels of  the foods you eat are a good source of this information. The label will tell you how big the serving size is and the total number of carbohydrate grams you will be eating per serving. Divide this number by 15 to obtain the number of carbohydrate servings in a portion. Remember, 1 carbohydrate serving equals 15 grams of carbohydrate. SERVING SIZES Measuring foods and serving sizes helps you make sure you are getting the right amount of food. The list below tells how big or small some common serving sizes are.  1 oz.........4 stacked dice.  3 oz........Marland KitchenDeck of cards.  1 tsp.......Marland KitchenTip of little finger.  1 tbs......Marland KitchenMarland KitchenThumb.  2 tbs.......Marland KitchenGolf ball.   cup......Marland KitchenHalf of a fist.  1 cup.......Marland KitchenA fist. SAMPLE DIABETES MEAL PLAN Below is a sample meal plan that includes foods from the grain and starches, dairy, vegetable, fruit, and meat groups. A dietician can individualize a meal plan to fit your calorie needs and tell you the number of servings needed from each food group. However, controlling the total amount of carbohydrates in your meal or snack is more important than making sure you include all of the food groups at every meal. You may interchange carbohydrate containing foods (dairy, starches, and fruits). The meal plan below is an example of a 2000 calorie diet using carbohydrate counting. This meal plan has 17 carbohydrate servings. Breakfast  1 cup oatmeal (2 carb servings).   cup light yogurt (1 carb serving).  1 cup blueberries (1 carb serving).   cup almonds. Snack  1 large apple (2 carb servings).  1 low-fat string cheese stick. Lunch  Chicken breast salad.  1 cup spinach.   cup chopped tomatoes.  2 oz chicken breast, sliced.  2 tbs low-fat [place] dressing.  12 whole-wheat crackers (2 carb servings).  12 to 15 grapes (1 carb serving).  1 cup low-fat milk (1 carb serving). Snack  1 cup carrots.   cup hummus (1 carb serving). Dinner  3 oz broiled  salmon.  1 cup brown rice (3 carb servings). Snack  1  cups steamed broccoli (1 carb serving) drizzled with 1 tsp olive oil and lemon juice.  1 cup light pudding (2 carb servings). DIABETES MEAL PLANNING WORKSHEET Your dietician can use this worksheet to help you decide how many servings of foods and what types of foods are right for you.  BREAKFAST Food Group and Servings / Carb Servings Grain/Starches __________________________________ Dairy __________________________________________ Vegetable ______________________________________ Fruit ___________________________________________ Meat __________________________________________ Fat ____________________________________________ LUNCH Food Group and  Servings / Carb Servings Grain/Starches ___________________________________ Dairy ___________________________________________ Fruit ____________________________________________ Meat ___________________________________________ Fat _____________________________________________ Laural Golden Food Group and Servings / Carb Servings Grain/Starches ___________________________________ Dairy ___________________________________________ Fruit ____________________________________________ Meat ___________________________________________ Fat _____________________________________________ SNACKS Food Group and Servings / Carb Servings Grain/Starches ___________________________________ Dairy ___________________________________________ Vegetable _______________________________________ Fruit ____________________________________________ Meat ___________________________________________ Fat _____________________________________________ DAILY TOTALS Starches _________________________ Vegetable ________________________ Fruit ____________________________ Dairy ____________________________ Meat ____________________________ Fat ______________________________ Document Released: 03/29/2005 Document Revised:  09/24/2011 Document Reviewed: 02/07/2009 [employer] Patient Information 2014 [employer]. Calorie Counting Diet A calorie counting diet requires you to eat the number of calories that are right for you in a day. Calories are the measurement of how much energy you get from the food you eat. Eating the right amount of calories is important for staying at a healthy weight. If you eat too many calories, your body will store them as fat and you may gain weight. If you eat too few calories, you may lose weight. Counting the number of calories you eat during a day will help you know if you are eating the right amount. A Registered Dietitian can determine how many calories you need in a day. The amount of calories needed varies from person to person. If your goal is to lose weight, you will need to eat fewer calories. Losing weight can benefit you if you are overweight or have health problems such as heart disease, high blood pressure, or diabetes. If your goal is to gain weight, you will need to eat more calories. Gaining weight may be necessary if you have a certain health problem that causes your body to need more energy. TIPS Whether you are increasing or decreasing the number of calories you eat during a day, it may be hard to get used to changes in what you eat and drink. The following are tips to help you keep track of the number of calories you eat.  Measure foods at home with measuring cups. This helps you know the amount of food and number of calories you are eating.  Restaurants often serve food in amounts that are larger than 1 serving. While eating out, estimate how many servings of a food you are given. For example, a serving of cooked rice is  cup or about the size of half of a fist. Knowing serving sizes will help you be aware of how much food you are eating at restaurants.  Ask for smaller portion sizes or child-size portions at restaurants.  Plan to eat half of a meal at a restaurant. Take  the rest home or share the other half with a friend.  Read the Nutrition Facts panel on food labels for calorie content and serving size. You can find out how many servings are in a package, the size of a serving, and the number of calories each serving has.  For example, a package might contain 3 cookies. The Nutrition Facts panel on that package says that 1 serving is 1 cookie. Below that, it will say there are 3 servings in the container. The calories section of the Nutrition Facts label says there are 90 calories. This means there are 90 calories in 1 cookie (1 serving). If you eat 1 cookie you have eaten 90 calories. If you eat all 3 cookies, you have eaten 270 calories (3 servings x 90 calories = 270 calories). The list below tells you how big or small some common portion sizes are.  1 oz.........4 stacked dice.  3 oz........Marland KitchenDeck of cards.  1 tsp.......Marland KitchenTip of little finger.  1 tbs......Marland Kitchen.Marland Kitchen.Thumb.  2 tbs.......Marland Kitchen.Golf ball.   cup......Marland Kitchen.Half of a fist.  1 cup.......Marland Kitchen.A fist. KEEP A FOOD LOG Write down every food item you eat, the amount you eat, and the number of calories in each food you eat during the day. At the end of the day, you can add up the total number of calories you have eaten. It may help to keep a list like the one below. Find out the calorie information by reading the Nutrition Facts panel on food labels. Breakfast  Bran cereal (1 cup, 110 calories).  Fat-free milk ( cup, 45 calories). Snack  Apple (1 medium, 80 calories). Lunch  Spinach (1 cup, 20 calories).  Tomato ( medium, 20 calories).  Chicken breast strips (3 oz, 165 calories).  Shredded cheddar cheese ( cup, 110 calories).  Light [place] dressing (2 tbs, 60 calories).  Whole-wheat bread (1 slice, 80 calories).  Tub margarine (1 tsp, 35 calories).  Vegetable soup (1 cup, 160 calories). Dinner  Pork chop (3 oz, 190 calories).  Brown rice (1 cup, 215 calories).  Steamed broccoli ( cup, 20  calories).  Strawberries (1  cup, 65 calories).  Whipped cream (1 tbs, 50 calories). Daily Calorie Total: 1425 Document Released: 07/02/2005 Document Revised: 09/24/2011 Document Reviewed: 12/27/2006 [facility] Patient Information 2014 [place]

## 2013-08-06 LAB — URINE CULTURE

## 2015-06-19 IMAGING — CR DG OS CALCIS 2+V*R*
2 series · 2 of 2 positions shown · non-contrast
Comparison: None.

CLINICAL DATA: Heel pain.

EXAM:
RIGHT OS CALCIS - 2+ VIEW

[axial]
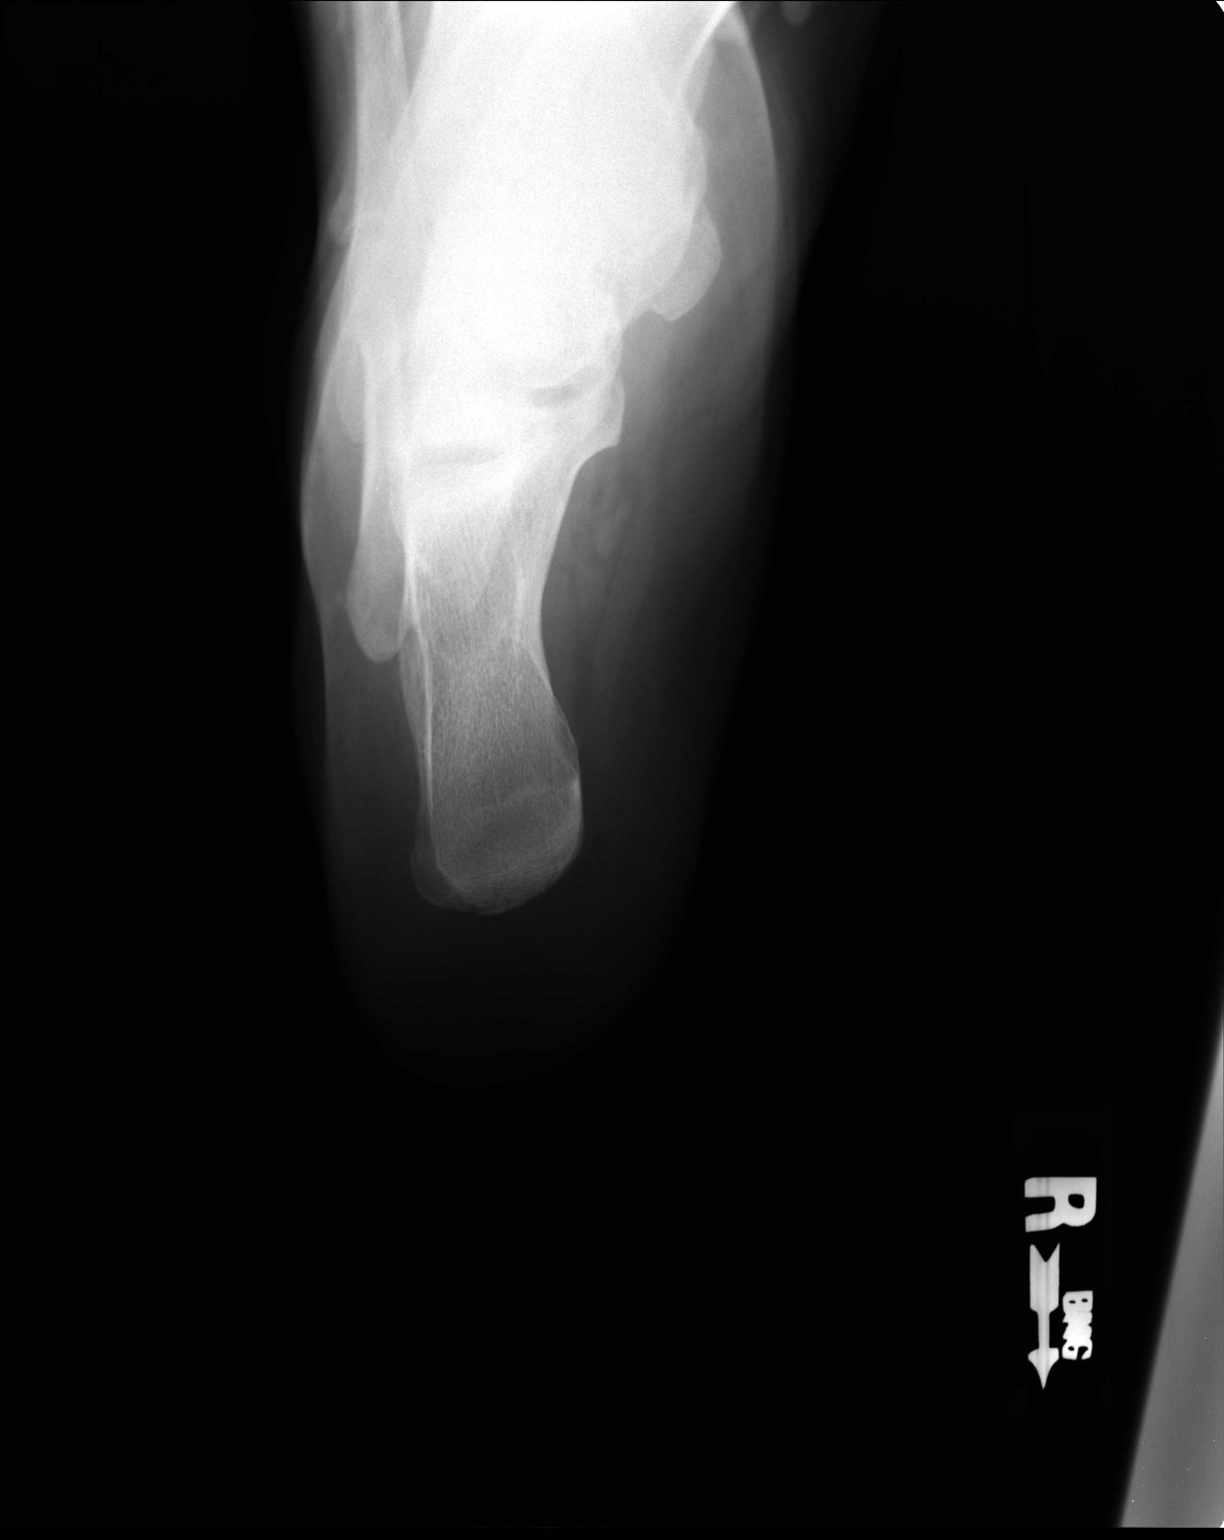

[lateral]
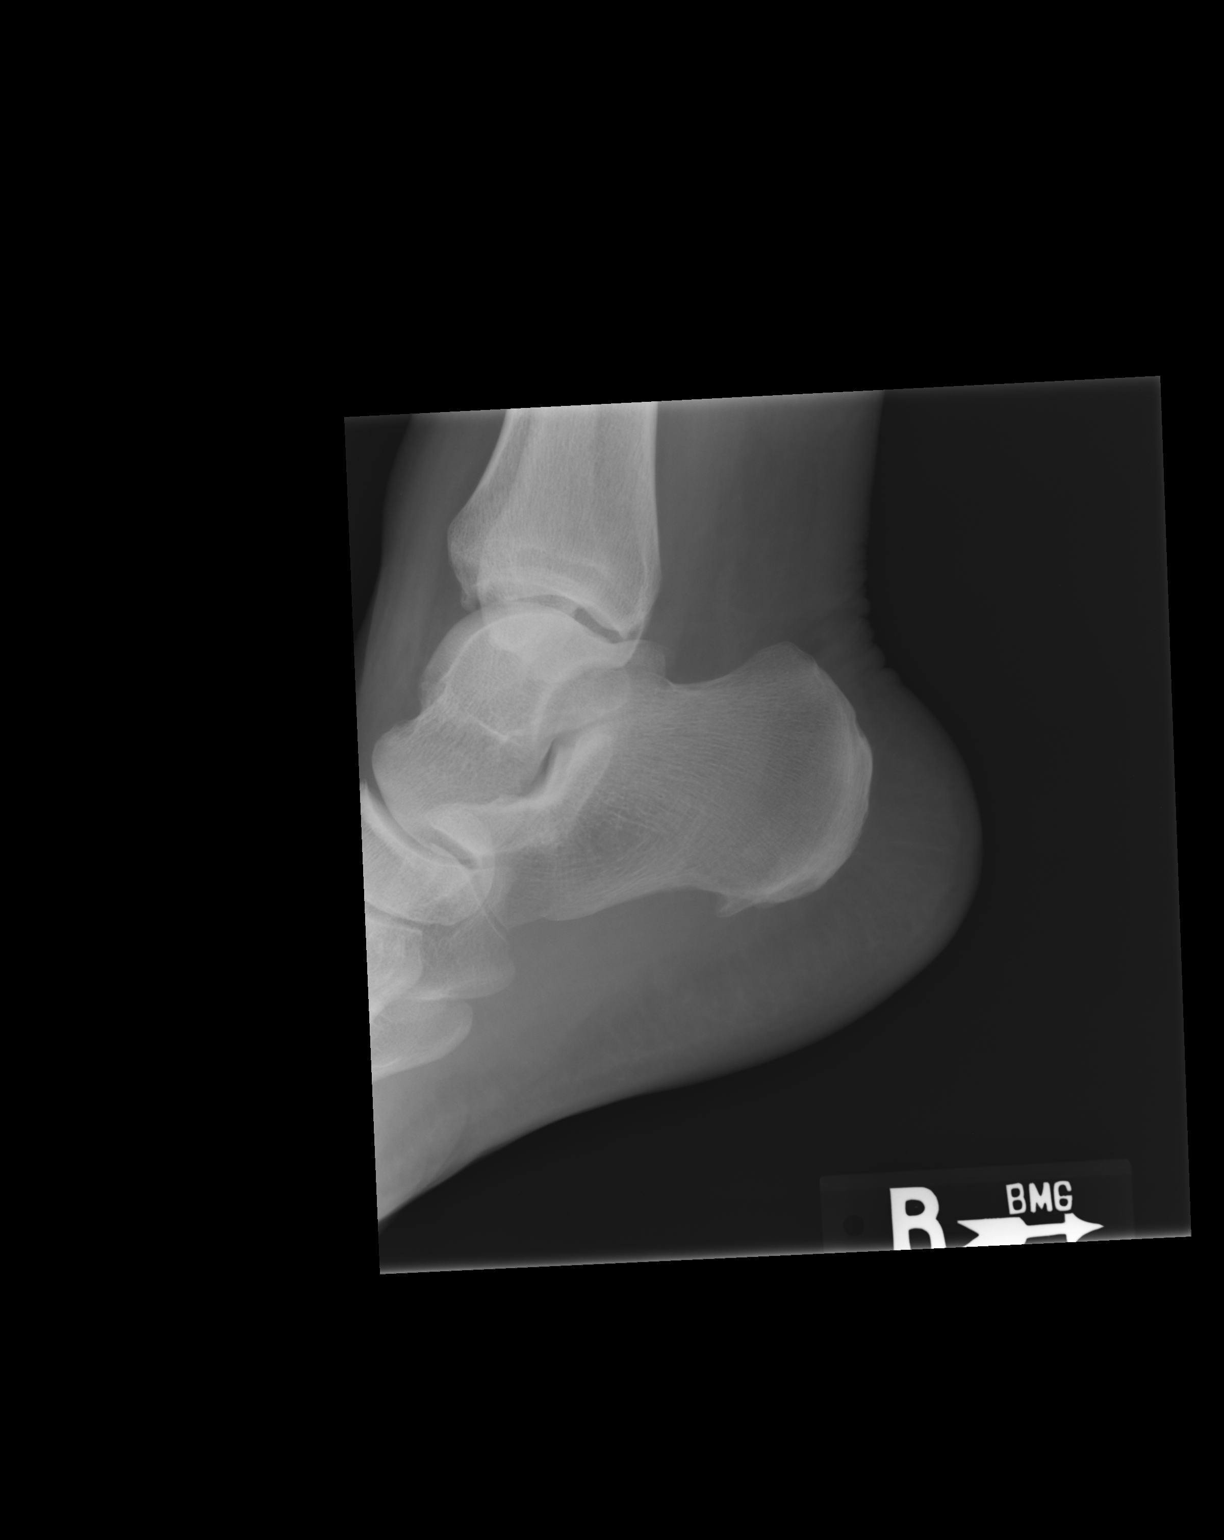

[2 of 2 positions shown; findings below may reference images not displayed]

FINDINGS: A small well-marginated plantar calcaneal spur is identified. Image
bones otherwise appear normal. Soft tissues are unremarkable.
IMPRESSION: Small plantar calcaneal spur.  Otherwise negative.

## 2019-02-13 ENCOUNTER — Other Ambulatory Visit: Payer: Self-pay

## 2019-02-13 DIAGNOSIS — Z20822 Contact with and (suspected) exposure to covid-19: Secondary | ICD-10-CM

## 2019-02-15 LAB — NOVEL CORONAVIRUS, NAA: SARS-CoV-2, NAA: NOT DETECTED

## 2019-05-30 ENCOUNTER — Other Ambulatory Visit: Payer: Self-pay

## 2019-05-30 DIAGNOSIS — Z20822 Contact with and (suspected) exposure to covid-19: Secondary | ICD-10-CM

## 2019-05-30 NOTE — Addendum Note (Signed)
Addended by: Luisa Dago A on: 05/30/2019 04:20 PM   Modules accepted: Orders

## 2019-06-01 LAB — NOVEL CORONAVIRUS, NAA: SARS-CoV-2, NAA: NOT DETECTED

## 2019-08-14 ENCOUNTER — Ambulatory Visit: Payer: Managed Care, Other (non HMO) | Attending: Internal Medicine

## 2019-08-14 DIAGNOSIS — Z20822 Contact with and (suspected) exposure to covid-19: Secondary | ICD-10-CM

## 2019-08-15 LAB — NOVEL CORONAVIRUS, NAA: SARS-CoV-2, NAA: NOT DETECTED

## 2019-09-19 ENCOUNTER — Ambulatory Visit: Payer: Managed Care, Other (non HMO) | Attending: Internal Medicine

## 2019-09-19 DIAGNOSIS — Z23 Encounter for immunization: Secondary | ICD-10-CM

## 2019-09-19 NOTE — Progress Notes (Signed)
   Covid-19 Vaccination Clinic  Name:  Madison Chandler    MRN: [id number] DOB: 05/05/1971  09/19/2019  [name] was observed post Covid-19 immunization for 15 minutes without incident. She was provided with Vaccine Information Sheet and instruction to access the V-Safe system.   [name] was instructed to call 911 with any severe reactions post vaccine: Marland Kitchen Difficulty breathing  . Swelling of face and throat  . A fast heartbeat  . A bad rash all over body  . Dizziness and weakness   Immunizations Administered    Name Date Dose VIS Date Route   Pfizer COVID-19 Vaccine 09/19/2019 10:54 AM 0.3 mL 06/26/2019 Intramuscular   Manufacturer: [employer], [employer]   Lot: [id number]   NDC: [address]-2

## 2019-10-10 ENCOUNTER — Ambulatory Visit: Payer: Managed Care, Other (non HMO) | Attending: Internal Medicine

## 2019-10-10 DIAGNOSIS — Z23 Encounter for immunization: Secondary | ICD-10-CM

## 2019-10-10 NOTE — Progress Notes (Signed)
   Covid-19 Vaccination Clinic  Name:  Madison Chandler    MRN: [id number] DOB: 03/12/1971  10/10/2019  Ms. Madison Chandler was observed post Covid-19 immunization for 15 minutes without incident. She was provided with Vaccine Information Sheet and instruction to access the V-Safe system.   Ms. Madison Chandler was instructed to call 911 with any severe reactions post vaccine: Marland Kitchen Difficulty breathing  . Swelling of face and throat  . A fast heartbeat  . A bad rash all over body  . Dizziness and weakness   Immunizations Administered    Name Date Dose VIS Date Route   Pfizer COVID-19 Vaccine 10/10/2019 11:53 AM 0.3 mL 06/26/2019 Intramuscular   Manufacturer: [employer], [employer]   Lot: [id number]   NDC: [address]-4

## 2020-04-11 ENCOUNTER — Emergency Department (HOSPITAL_BASED_OUTPATIENT_CLINIC_OR_DEPARTMENT_OTHER)
Admission: EM | Admit: 2020-04-11 | Discharge: 2020-04-11 | Disposition: A | Discharge status: Home / Self Care | Payer: BC Managed Care – PPO | Attending: Emergency Medicine | Admitting: Emergency Medicine

## 2020-04-11 ENCOUNTER — Other Ambulatory Visit: Payer: Self-pay

## 2020-04-11 ENCOUNTER — Encounter (HOSPITAL_BASED_OUTPATIENT_CLINIC_OR_DEPARTMENT_OTHER): Payer: Self-pay | Admitting: *Deleted

## 2020-04-11 DIAGNOSIS — Z7982 Long term (current) use of aspirin: Secondary | ICD-10-CM | POA: Diagnosis not present

## 2020-04-11 DIAGNOSIS — I10 Essential (primary) hypertension: Secondary | ICD-10-CM | POA: Insufficient documentation

## 2020-04-11 DIAGNOSIS — Z79899 Other long term (current) drug therapy: Secondary | ICD-10-CM | POA: Diagnosis not present

## 2020-04-11 DIAGNOSIS — E119 Type 2 diabetes mellitus without complications: Secondary | ICD-10-CM | POA: Insufficient documentation

## 2020-04-11 DIAGNOSIS — G5701 Lesion of sciatic nerve, right lower limb: Secondary | ICD-10-CM | POA: Diagnosis not present

## 2020-04-11 DIAGNOSIS — Z7984 Long term (current) use of oral hypoglycemic drugs: Secondary | ICD-10-CM | POA: Insufficient documentation

## 2020-04-11 DIAGNOSIS — M79651 Pain in right thigh: Secondary | ICD-10-CM | POA: Diagnosis present

## 2020-04-11 HISTORY — DX: Gout, unspecified: M10.9

## 2020-04-11 MED ORDER — KETOROLAC TROMETHAMINE 30 MG/ML IJ SOLN
30.0000 mg | Freq: Once | INTRAMUSCULAR | Status: AC
  Administered 2020-04-11: 30 mg via INTRAMUSCULAR
  Filled 2020-04-11: qty 1

## 2020-04-11 NOTE — ED Provider Notes (Signed)
[facility] EMERGENCY DEPARTMENT Provider Note   CSN: [id number] Arrival date & time: 04/11/20  0545     History Chief Complaint  Patient presents with  . right hip/upper thigh pain    Madison Chandler is a 49 y.o. female.  HPI     This a 49 year old female with a history of diabetes and hypertension who presents with right buttock and leg pain.  Patient reports history of sciatica.  She states on Friday she developed pain in the right buttock that radiates down into the right thigh.  It also radiates upwards at times.  It is worse when she sits up.  She has been taking indomethacin with some relief.  She currently rates her pain at 5 out of 10.  She denies any weakness, numbness, tingling of the lower extremities.  Denies injury.  Denies bowel or bladder injury  Past Medical History:  Diagnosis Date  . Diabetes mellitus   . Diabetes mellitus   . Gout   . Hypertension   . Obesity     Patient Active Problem List   Diagnosis Date Noted  . Hypertension   . Obesity   . Diabetes mellitus     History reviewed. No pertinent surgical history.   OB History   No obstetric history on file.     Family History  Problem Relation Age of Onset  . Diabetes Mother   . Kidney disease Mother   . Hypertension Mother   . Hypertension Father     Social History   Tobacco Use  . Smoking status: Never Smoker  . Smokeless tobacco: Never Used  Substance Use Topics  . Alcohol use: No  . Drug use: No    Home Medications Prior to Admission medications   Medication Sig Start Date End Date Taking? Authorizing Provider  COLCHICINE PO Take by mouth as needed.   Yes [provider]  Dulaglutide (TRULICITY [place]) Inject into the skin.   Yes [provider]  glipiZIDE (GLUCOTROL) 5 MG tablet Take 5 mg by mouth daily before breakfast. 2 daily   Yes [provider]  INDOMETHACIN PO Take by mouth as needed.   Yes [provider]  aspirin 81 MG  tablet Take 160 mg by mouth daily.    Dois Davenport, MD  atorvastatin (LIPITOR) 10 MG tablet Take 1 tablet (10 mg total) by mouth daily. 08/04/13   Jonita Albee, MD  lisinopril-hydrochlorothiazide (PRINZIDE,ZESTORETIC) 20-12.5 MG per tablet Take 1 tablet by mouth daily. PATIENT NEEDS OFFICE VISIT FOR ADDITIONAL REFILLS 08/04/13   Jonita Albee, MD  metFORMIN (GLUCOPHAGE) 1000 MG tablet Take 1 tablet (1,000 mg total) by mouth 2 (two) times daily with a meal. 08/04/13   Guest, Ashley Jacobs, MD  metoCLOPramide (REGLAN) 5 MG tablet Take 1 tablet (5 mg total) by mouth 4 (four) times daily. 01/05/13   Peyton Najjar, MD  omeprazole (PRILOSEC) 40 MG capsule Take 1 capsule (40 mg total) by mouth daily. 01/05/13   Peyton Najjar, MD  Vitamin D, Ergocalciferol, (DRISDOL) 50000 UNITS CAPS capsule Take 50,000 Units by mouth every 7 (seven) days.    [provider]    Allergies    Patient has no known allergies.  Review of Systems   Review of Systems  Constitutional: Negative for fever.  Genitourinary: Negative for difficulty urinating.  Musculoskeletal: Positive for back pain.  Neurological: Negative for weakness and numbness.  All other systems reviewed and are negative.   Physical  Exam Updated Vital Signs BP (!) 156/90 (BP Location: Right Arm)   Pulse 74   Temp 98.4 F (36.9 C) (Oral)   Resp 20   Ht 1.727 m (5\' 8" )   Wt 120.2 kg   LMP 04/03/2020   SpO2 96%   BMI 40.29 kg/m   Physical Exam Vitals and nursing note reviewed.  Constitutional:      Appearance: She is well-developed. She is obese. She is not ill-appearing.  HENT:     Head: Normocephalic and atraumatic.     Mouth/Throat:     Mouth: Mucous membranes are moist.  Eyes:     Pupils: Pupils are equal, round, and reactive to light.  Cardiovascular:     Rate and Rhythm: Normal rate and regular rhythm.  Pulmonary:     Effort: Pulmonary effort is normal. No respiratory distress.  Abdominal:     Palpations: Abdomen is  soft.     Tenderness: There is no abdominal tenderness.  Musculoskeletal:     Cervical back: Neck supple.     Comments: Tenderness palpation of the right piriformis positive right straight leg raise, no tenderness palpation of the midline lower lumbar spine  Skin:    General: Skin is warm and dry.  Neurological:     Mental Status: She is alert and oriented to person, place, and time.     Comments: 5/5 strength bilateral lower extremities  Psychiatric:        Mood and Affect: Mood normal.     ED Results / Procedures / Treatments   Labs (all labs ordered are listed, but only abnormal results are displayed) Labs Reviewed - No data to display  EKG None  Radiology No results found.  Procedures Procedures (including critical care time)  Medications Ordered in ED Medications  ketorolac (TORADOL) 30 MG/ML injection 30 mg (has no administration in time range)    ED Course  I have reviewed the triage vital signs and the nursing notes.  Pertinent labs & imaging results that were available during my care of the patient were reviewed by me and considered in my medical decision making (see chart for details).    MDM Rules/Calculators/A&P                           Patient presents with pain in the right buttock that radiates to the right thigh.  She is overall nontoxic vital signs are notable for blood pressure 156/90.  History and physical exam is most consistent with sciatica related to piriformis syndrome.  She is tender along the buttock.  She is neurologically intact and no red flags.  No signs or symptoms of cauda equina.  Discussed with her treatment including scheduled indomethacin for the next 3 to 5 days, heat, and stretching.  She may benefit from physical therapy or massage as well.  Patient stated understanding.  After history, exam, and medical workup I feel the patient has been appropriately medically screened and is safe for discharge home. Pertinent diagnoses were  discussed with the patient. Patient was given return precautions.   Final Clinical Impression(s) / ED Diagnoses Final diagnoses:  Piriformis syndrome of right side    Rx / DC Orders ED Discharge Orders    None       Teneisha Gignac, 04/05/2020, MD 04/11/20 [phone]

## 2020-04-11 NOTE — Discharge Instructions (Addendum)
Tinea indomethacin scheduled for the next 3 to 5 days.  Make sure to stretch and work out the muscles in your buttock.  You may benefit from physical therapy and massage.

## 2020-04-11 NOTE — ED Triage Notes (Addendum)
C/o right buttocks, hip, that radiates into her right thigh. States this pain started on Friday. States pain comes and goes. Hx of sciatica. Pain worse when sitting straight up.  Denies any urinary/bowel incontinence.

## 2020-04-20 LAB — COLOGUARD: COLOGUARD: NEGATIVE

## 2022-03-12 ENCOUNTER — Other Ambulatory Visit: Payer: Self-pay | Admitting: Obstetrics and Gynecology

## 2022-03-12 DIAGNOSIS — R928 Other abnormal and inconclusive findings on diagnostic imaging of breast: Secondary | ICD-10-CM

## 2022-03-27 ENCOUNTER — Ambulatory Visit
Admission: RE | Admit: 2022-03-27 | Discharge: 2022-03-27 | Disposition: A | Discharge status: Home / Self Care | Payer: BC Managed Care – PPO | Source: Ambulatory Visit | Attending: Obstetrics and Gynecology | Admitting: Obstetrics and Gynecology

## 2022-03-27 DIAGNOSIS — R928 Other abnormal and inconclusive findings on diagnostic imaging of breast: Secondary | ICD-10-CM

## 2023-04-25 LAB — COLOGUARD: COLOGUARD: NEGATIVE

## 2024-05-21 ENCOUNTER — Other Ambulatory Visit: Payer: Self-pay | Admitting: Obstetrics and Gynecology

## 2024-05-21 DIAGNOSIS — R928 Other abnormal and inconclusive findings on diagnostic imaging of breast: Secondary | ICD-10-CM

## 2024-05-26 ENCOUNTER — Ambulatory Visit
Admission: RE | Admit: 2024-05-26 | Discharge: 2024-05-26 | Disposition: A | Discharge status: Home / Self Care | Payer: Self-pay | Source: Ambulatory Visit | Attending: Obstetrics and Gynecology | Admitting: Obstetrics and Gynecology

## 2024-05-26 ENCOUNTER — Other Ambulatory Visit: Payer: Self-pay | Admitting: Obstetrics and Gynecology

## 2024-05-26 DIAGNOSIS — R921 Mammographic calcification found on diagnostic imaging of breast: Secondary | ICD-10-CM

## 2024-05-26 DIAGNOSIS — R928 Other abnormal and inconclusive findings on diagnostic imaging of breast: Secondary | ICD-10-CM

## 2024-06-01 ENCOUNTER — Ambulatory Visit
Admission: RE | Admit: 2024-06-01 | Discharge: 2024-06-01 | Disposition: A | Discharge status: Home / Self Care | Source: Ambulatory Visit | Attending: Obstetrics and Gynecology | Admitting: Obstetrics and Gynecology

## 2024-06-01 ENCOUNTER — Ambulatory Visit
Admission: RE | Admit: 2024-06-01 | Discharge: 2024-06-01 | Disposition: A | Source: Ambulatory Visit | Attending: Obstetrics and Gynecology | Admitting: Obstetrics and Gynecology

## 2024-06-01 DIAGNOSIS — R921 Mammographic calcification found on diagnostic imaging of breast: Secondary | ICD-10-CM

## 2024-06-01 HISTORY — PX: BREAST BIOPSY: SHX20

## 2024-06-02 LAB — SURGICAL PATHOLOGY
